# Patient Record
Sex: Male | Born: 1935 | Race: White | Hispanic: No | Marital: Single | Smoking: Never smoker
Health system: Southern US, Community
[De-identification: ages and names within clinical notes are randomized; demographics above are authoritative.]

## PROBLEM LIST (undated history)

## (undated) DIAGNOSIS — N529 Male erectile dysfunction, unspecified: Secondary | ICD-10-CM

## (undated) DIAGNOSIS — R350 Frequency of micturition: Secondary | ICD-10-CM

## (undated) DIAGNOSIS — R42 Dizziness and giddiness: Secondary | ICD-10-CM

## (undated) HISTORY — DX: Frequency of micturition: R35.0

## (undated) HISTORY — DX: Dizziness and giddiness: R42

## (undated) HISTORY — PX: TONSILLECTOMY: SUR1361

## (undated) HISTORY — PX: OTHER SURGICAL HISTORY: SHX169

## (undated) HISTORY — DX: Male erectile dysfunction, unspecified: N52.9

---

## 2004-11-26 ENCOUNTER — Ambulatory Visit: Payer: Self-pay | Admitting: Internal Medicine

## 2007-04-28 ENCOUNTER — Ambulatory Visit: Payer: Self-pay | Admitting: Internal Medicine

## 2007-07-28 ENCOUNTER — Ambulatory Visit: Payer: Self-pay | Admitting: Internal Medicine

## 2009-11-30 ENCOUNTER — Emergency Department: Payer: Self-pay | Admitting: Emergency Medicine

## 2011-03-26 ENCOUNTER — Emergency Department: Payer: Self-pay | Admitting: Unknown Physician Specialty

## 2013-11-17 DIAGNOSIS — M5416 Radiculopathy, lumbar region: Secondary | ICD-10-CM | POA: Insufficient documentation

## 2014-01-10 DIAGNOSIS — M47817 Spondylosis without myelopathy or radiculopathy, lumbosacral region: Secondary | ICD-10-CM | POA: Insufficient documentation

## 2014-02-26 ENCOUNTER — Other Ambulatory Visit: Payer: Self-pay | Admitting: Internal Medicine

## 2014-02-26 DIAGNOSIS — IMO0002 Reserved for concepts with insufficient information to code with codable children: Secondary | ICD-10-CM | POA: Insufficient documentation

## 2014-02-26 DIAGNOSIS — F209 Schizophrenia, unspecified: Secondary | ICD-10-CM | POA: Insufficient documentation

## 2014-02-26 DIAGNOSIS — D696 Thrombocytopenia, unspecified: Secondary | ICD-10-CM | POA: Insufficient documentation

## 2014-02-26 DIAGNOSIS — R569 Unspecified convulsions: Secondary | ICD-10-CM | POA: Insufficient documentation

## 2014-02-26 LAB — PHENYTOIN LEVEL, TOTAL: Dilantin: 15.5 ug/mL (ref 10.0–20.0)

## 2014-09-04 ENCOUNTER — Ambulatory Visit: Payer: Self-pay | Admitting: Internal Medicine

## 2014-09-04 DIAGNOSIS — D649 Anemia, unspecified: Secondary | ICD-10-CM | POA: Insufficient documentation

## 2014-09-04 LAB — PHENYTOIN LEVEL, TOTAL: Dilantin: 19.3 ug/mL (ref 10.0–20.0)

## 2015-04-03 ENCOUNTER — Telehealth: Payer: Self-pay | Admitting: Urology

## 2015-04-03 NOTE — Telephone Encounter (Signed)
/  Spoke with pt who stated he will just wait until Dr. Edwyna Shell gets back. Pt stated he has an appt 04/25/15. Cw,lpn

## 2015-04-03 NOTE — Telephone Encounter (Signed)
That is a pretty low dose of sildenafil (20 mg).  Viagra is 50 mg to 100 mg.  He is also taking phenytoin which can reduce the effectiveness of sildenafil.  If had such a severe reaction to such a low dose, I don't believe he is a candidate for oral medications for ED.  He should have an office visit to discuss other treatment options, such as: Trimix, MUSE or vacuum erect aid devices.

## 2015-04-03 NOTE — Telephone Encounter (Signed)
Patient called stating that he tried one pill of the Sildenafil 20mg  last night and it caused him to have extreme weakness and numbness in his legs he could barely walk. He states that he already has trouble with his legs but this was not the issue. I instructed the patient to not take anymore. He states that he would like to try another medication for his ED. Can a different oral medication be sent for him to try or does he need an apt with Dr. Edwyna Shell to evaluate for different treatment options.

## 2015-04-24 ENCOUNTER — Ambulatory Visit: Payer: Self-pay | Admitting: Urology

## 2015-04-25 ENCOUNTER — Encounter: Payer: Self-pay | Admitting: Urology

## 2015-04-25 ENCOUNTER — Ambulatory Visit (INDEPENDENT_AMBULATORY_CARE_PROVIDER_SITE_OTHER): Payer: Self-pay | Admitting: Urology

## 2015-04-25 VITALS — BP 108/74 | HR 86 | Resp 18 | Ht 72.0 in | Wt 128.0 lb

## 2015-04-25 DIAGNOSIS — N5203 Combined arterial insufficiency and corporo-venous occlusive erectile dysfunction: Secondary | ICD-10-CM

## 2015-04-25 NOTE — Progress Notes (Signed)
04/25/2015 10:48 AM   Ryan PetersWilliam T Dalton 12-Sep-1930 [id number]  Referring provider: No referring provider defined for this encounter.  Chief Complaint  Patient presents with  . Follow-up    1 mo. f/u  . Erectile Dysfunction    HPI: Patient is an 81 year old who is unable to sustain an erection much less have one. He has tried sildenafil 20 mg #3 and has had no erectile function with this. His some side effects from this drug R leg pain and joint pain. He has normal anatomy uncircumcised phallus. It's been 7 months since he's been able to have an erection. I gave him Cialis 5 mg daily to try and see if this made a difference in his erections. We also discussed possibility of self injection either trimix or alprostadil. Back and also a consideration the wound might prevent   PMH: No past medical history on file.  Surgical History: No past surgical history on file.  Home Medications:    Medication List    Notice  As of 04/25/2015 10:48 AM   You have not been prescribed any medications.      Allergies: Allergies not on file  Family History: No family history on file.  Social History:  has no tobacco, alcohol, and drug history on file.  ROS:.Urological Symptom ReviewUROLOGY Frequent Urination?: Yes Hard to postpone urination?: No Burning/pain with urination?: No Get up at night to urinate?: Yes Leakage of urine?: No Urine stream starts and stops?: No Trouble starting stream?: No Do you have to strain to urinate?: No Blood in urine?: No Urinary tract infection?: No Sexually transmitted disease?: No Injury to kidneys or bladder?: No Painful intercourse?: No Weak stream?: No Erection problems?: Yes Penile pain?: No Gastrointestinal Nausea?: No Vomiting?: No Indigestion/heartburn?: No Diarrhea?: No Constipation?: No Constitutional Fever: No Night sweats?: No Weight loss?: No Fatigue?: No Skin Skin rash/lesions?: No Itching?: No Eyes Blurred vision?:  No Double vision?: No Ears/Nose/Throat Sore throat?: No Sinus problems?: No Hematologic/Lymphatic Swollen glands?: No Easy bruising?: No Cardiovascular Leg swelling?: No Chest pain?: No Respiratory Cough?: No Shortness of breath?: No Endocrine Excessive thirst?: No Musculoskeletal Back pain?: No Joint pain?: No Neurological Headaches?: No Dizziness?: No Psychologic Depression?: No Anxiety?: No               Physical Exam: There were no vitals taken for this visit.  Constitutional:  Alert and oriented, No acute distress. HEENT:  AT, moist mucus membranes.  Trachea midline, no masses. Cardiovascular: No clubbing, cyanosis, or edema. Respiratory: Normal respiratory effort, no increased work of breathing. GI: Abdomen is soft, nontender, nondistended, no abdominal masses GU: No CVA tenderness. Penis and testes normal Skin: No rashes, bruises or suspicious lesions. Lymph: No cervical or inguinal adenopathy. Neurologic: Grossly intact, no focal deficits, moving all 4 extremities. Psychiatric: Normal mood and affect.  Laboratory Data: No results found for: WBC, HGB, HCT, MCV, PLT  No results found for: CREATININE  No results found for: PSA  No results found for: TESTOSTERONE  No results found for: HGBA1C  Urinalysis No results found for: COLORURINE, APPEARANCEUR, LABSPEC, PHURINE, GLUCOSEU, HGBUR, BILIRUBINUR, KETONESUR, PROTEINUR, UROBILINOGEN, NITRITE, LEUKOCYTESUR  Pertinent Imaging: None  Assessment & Plan:  Erectile dysfunction 81 year old male. Plan to switch from sildenafil to tell [place] the form of Cialis 5 mg daily. Follow-up visit in 6 weeks samples are given to the patient Erectile dysfunction  No Follow-up on file.  Lorraine Laxichard D Buel Molder, MD  [facility] [address], [address] [place],  [place] [address] [phone]

## 2015-05-03 ENCOUNTER — Telehealth: Payer: Self-pay

## 2015-05-03 NOTE — Telephone Encounter (Signed)
Pt called c/o dizziness with taking cialis. Nurse advised pt not to take medication anymore. Pt was not happy with the answer and asked then "what am I supposed to do"? Pt was very addamant about having an erection. Pt stated at this time he was taking 1.5-2 pills a day for erection purposes. Nurse strongly advised pt only to take 1 pill a day. Pt voiced understanding. Nurse advised pt to take only 1 pill a day over the weekend and to call back with effects. Nurse made pt aware if dizziness continues then the medication would have to be stopped and something else rx. Pt voiced understanding. Cw,lpn

## 2015-06-11 ENCOUNTER — Ambulatory Visit (INDEPENDENT_AMBULATORY_CARE_PROVIDER_SITE_OTHER): Payer: BLUE CROSS/BLUE SHIELD | Admitting: Urology

## 2015-06-11 ENCOUNTER — Encounter: Payer: Self-pay | Admitting: Urology

## 2015-06-11 VITALS — BP 98/63 | HR 82 | Ht 74.0 in | Wt 127.5 lb

## 2015-06-11 DIAGNOSIS — N529 Male erectile dysfunction, unspecified: Secondary | ICD-10-CM | POA: Diagnosis not present

## 2015-06-11 NOTE — Progress Notes (Signed)
06/11/2015 12:05 PM   Ryan Dalton [birth year] [id number]  Referring provider: No referring provider defined for this encounter.  Chief Complaint  Patient presents with  . Erectile Dysfunction    6-8 week followup    HPI: Patient did not have good success was Cialis 5 mg daily basis. We think he is made and taken to a day but patient said he took 1 caused chest pain shortness breath so he stopped. Side discussed with him the use of Trimix and injectable into his penis for his sexual dysfunction will call that prescription in for him. HPI   PMH: Past Medical History  Diagnosis Date  . ED (erectile dysfunction)   . Urinary frequency   . Dizziness     Surgical History: Past Surgical History  Procedure Laterality Date  . Tonsillectomy    . Cataract surgery      Home Medications:    Medication List       This list is accurate as of: 06/11/15 12:05 PM.  Always use your most recent med list.               haloperidol 1 MG tablet  Commonly known as:  HALDOL     haloperidol decanoate 100 MG/ML injection  Commonly known as:  HALDOL DECANOATE     phenytoin 100 MG ER capsule  Commonly known as:  DILANTIN     PX COMPLETE SENIOR MULTIVITS Tabs  Take by mouth.        Allergies: No Known Allergies  Family History: Family History  Problem Relation Age of Onset  . Prostate cancer Neg Hx   . Kidney cancer Neg Hx   . Bladder Cancer Neg Hx     Social History:  reports that he has never smoked. He does not have any smokeless tobacco history on file. He reports that he does not drink alcohol or use illicit drugs.  ROS: UROLOGY Frequent Urination?: Yes Hard to postpone urination?: No Burning/pain with urination?: No Get up at night to urinate?: No Leakage of urine?: No Urine stream starts and stops?: No Trouble starting stream?: No Do you have to strain to urinate?: No Blood in urine?: No Urinary tract infection?: No Sexually transmitted disease?:  No Injury to kidneys or bladder?: No Painful intercourse?: No Weak stream?: No Erection problems?: Yes Penile pain?: No  Gastrointestinal Nausea?: No Vomiting?: No Indigestion/heartburn?: No Diarrhea?: No Constipation?: No  Constitutional Fever: No Night sweats?: No Weight loss?: No Fatigue?: No  Skin Skin rash/lesions?: No Itching?: No  Eyes Blurred vision?: No Double vision?: No  Ears/Nose/Throat Sore throat?: No Sinus problems?: No  Hematologic/Lymphatic Swollen glands?: No Easy bruising?: No  Cardiovascular Leg swelling?: No Chest pain?: No  Respiratory Cough?: No Shortness of breath?: No  Endocrine Excessive thirst?: No  Musculoskeletal Back pain?: No Joint pain?: No  Neurological Headaches?: No Dizziness?: Yes  Psychologic Depression?: No Anxiety?: No  Physical Exam: BP 98/63 mmHg  Pulse 82  Ht 6\' 2"  (1.88 m)  Wt 127 lb 8 oz (57.834 kg)  BMI 16.36 kg/m2  Constitutional:  Alert and oriented, No acute distress. HEENT: [place] AT, moist mucus membranes.  Trachea midline, no masses. Cardiovascular: No clubbing, cyanosis, or edema. Respiratory: Normal respiratory effort, no increased work of breathing. GI: Abdomen is soft, nontender, nondistended, no abdominal masses GU: No CVA tenderness.  Skin: No rashes, bruises or suspicious lesions. Lymph: No cervical or inguinal adenopathy. Neurologic: Grossly intact, no focal deficits, moving all 4 extremities. Psychiatric: Normal  mood and affect.  Laboratory Data: No results found for: WBC, HGB, HCT, MCV, PLT  No results found for: CREATININE  No results found for: PSA  No results found for: TESTOSTERONE  No results found for: HGBA1C  Urinalysis No results found for: COLORURINE, APPEARANCEUR, LABSPEC, PHURINE, GLUCOSEU, HGBUR, BILIRUBINUR, KETONESUR, PROTEINUR, UROBILINOGEN, NITRITE, LEUKOCYTESUR  Pertinent Imaging: None  Assessment and Plan: Impotence. Called in prescription to  compounding lab for Trymex show the patient uses next visit in 2 weeks      Problem List Items Addressed This Visit    None      No Follow-up on file.  Lorraine Lax, MD  [facility] [address], [address] [place], [place] [address] [phone]

## 2015-06-27 ENCOUNTER — Ambulatory Visit (INDEPENDENT_AMBULATORY_CARE_PROVIDER_SITE_OTHER): Payer: Medicare Other | Admitting: Urology

## 2015-06-27 ENCOUNTER — Encounter: Payer: Self-pay | Admitting: Urology

## 2015-06-27 VITALS — BP 93/65 | HR 76 | Resp 16 | Wt 126.6 lb

## 2015-06-27 DIAGNOSIS — N529 Male erectile dysfunction, unspecified: Secondary | ICD-10-CM

## 2015-06-27 NOTE — Progress Notes (Signed)
Patient has a 0.1 mL of Trymex. Injected in the right side of his penis. Instructed him how to do it and had him do it with some help. He'll inform us as to the effect positive or negative Trimix we can make a decision as to whether he needs of a greater dose of the Trimix. He'll return in 1 week for recheck on his skills in doing self injections.

## 2015-07-04 ENCOUNTER — Ambulatory Visit (INDEPENDENT_AMBULATORY_CARE_PROVIDER_SITE_OTHER): Payer: Medicare Other | Admitting: Urology

## 2015-07-04 VITALS — BP 101/65 | HR 87 | Ht 74.0 in | Wt 127.1 lb

## 2015-07-04 DIAGNOSIS — N529 Male erectile dysfunction, unspecified: Secondary | ICD-10-CM

## 2015-07-23 ENCOUNTER — Ambulatory Visit (INDEPENDENT_AMBULATORY_CARE_PROVIDER_SITE_OTHER): Payer: Medicare Other | Admitting: Urology

## 2015-07-23 VITALS — BP 94/64 | HR 88 | Ht 74.0 in | Wt 127.7 lb

## 2015-07-23 DIAGNOSIS — N529 Male erectile dysfunction, unspecified: Secondary | ICD-10-CM

## 2015-07-23 NOTE — Progress Notes (Signed)
Injected 0.3 mg Trymex today. Patient failed 0.10 as well as each of self injection tomorrow in order his Trymex at 0.3 mg per injection. He will need to be taught how to do this in the future.

## 2015-07-25 ENCOUNTER — Ambulatory Visit (INDEPENDENT_AMBULATORY_CARE_PROVIDER_SITE_OTHER): Payer: Medicare Other | Admitting: Urology

## 2015-07-25 ENCOUNTER — Encounter: Payer: Self-pay | Admitting: Urology

## 2015-07-25 VITALS — BP 118/81 | HR 73 | Ht 74.0 in | Wt 129.0 lb

## 2015-07-25 DIAGNOSIS — N529 Male erectile dysfunction, unspecified: Secondary | ICD-10-CM | POA: Diagnosis not present

## 2015-07-25 NOTE — Progress Notes (Signed)
The patient has another injection Trymex today of 0.5 mg into the right corpora today. He'll let us know is results and it he gets some results from and will give him another injection in a couple of weeks. He had a partial erection with the 0.4 dose 2 days ago. I think we finally found acceptable dose. He has no other complaints today penis is well circumcised easy to inject as good length and girth.

## 2015-07-25 NOTE — Progress Notes (Signed)
Inject the patient with 0.3 mg of Trymex. This doesn't work he is to come back and we will give him a new Trymex solution.

## 2015-08-07 ENCOUNTER — Ambulatory Visit (INDEPENDENT_AMBULATORY_CARE_PROVIDER_SITE_OTHER): Payer: Medicare Other | Admitting: Urology

## 2015-08-07 ENCOUNTER — Encounter: Payer: Self-pay | Admitting: Urology

## 2015-08-07 VITALS — BP 112/74 | HR 82 | Ht 74.0 in | Wt 127.8 lb

## 2015-08-07 DIAGNOSIS — N5201 Erectile dysfunction due to arterial insufficiency: Secondary | ICD-10-CM | POA: Diagnosis not present

## 2015-08-07 NOTE — Progress Notes (Signed)
Patient is doing well on 0.5 mL Trimex. We will order several syringes so he can have them when necessary. He feels the 10 doses that should last him quite a while. He is quite pleased with our help in his desire for erections. He's been warned of course about priapism but he has finally found a dose of Trimex or chills.

## 2015-08-07 NOTE — Progress Notes (Signed)
I called in a script for x 10(ten) 1ml vial syringes of Trimix 0.635ml same strength.

## 2015-08-20 ENCOUNTER — Ambulatory Visit (INDEPENDENT_AMBULATORY_CARE_PROVIDER_SITE_OTHER): Payer: Medicare Other | Admitting: Urology

## 2015-08-20 VITALS — BP 88/58 | HR 70 | Ht 74.0 in | Wt 126.3 lb

## 2015-08-20 DIAGNOSIS — N529 Male erectile dysfunction, unspecified: Secondary | ICD-10-CM | POA: Diagnosis not present

## 2015-08-20 NOTE — Progress Notes (Signed)
Patient was able to self inject today 0.5with out difficulty. We will continue to flutter his Trymex through this office. We will phone in to the pharmacy  Necessary. if he has any trouble with his injections he can certainly return to come in and regular his injections skills

## 2015-10-10 ENCOUNTER — Encounter: Payer: Self-pay | Admitting: Urology

## 2015-10-10 ENCOUNTER — Other Ambulatory Visit: Payer: Self-pay

## 2015-10-10 ENCOUNTER — Ambulatory Visit: Payer: Medicare Other | Admitting: Urology

## 2015-10-17 ENCOUNTER — Ambulatory Visit (INDEPENDENT_AMBULATORY_CARE_PROVIDER_SITE_OTHER): Payer: Medicare Other | Admitting: Urology

## 2015-10-17 ENCOUNTER — Encounter: Payer: Self-pay | Admitting: Urology

## 2015-10-17 VITALS — BP 101/67 | HR 76 | Ht 74.0 in | Wt 126.1 lb

## 2015-10-17 DIAGNOSIS — N5319 Other ejaculatory dysfunction: Secondary | ICD-10-CM | POA: Diagnosis not present

## 2015-10-17 DIAGNOSIS — N5201 Erectile dysfunction due to arterial insufficiency: Secondary | ICD-10-CM | POA: Insufficient documentation

## 2015-10-17 NOTE — Progress Notes (Signed)
10/17/2015 11:51 AM   Ryan Dalton 07/12/1930 [id number]  Referring provider: Rafael BihariJohn B Walker III, MD [phone] [facility] [place], [place] [id number]  Chief Complaint  Patient presents with  . Follow-up    erecticle dysfunction    HPI: Ryan Dalton is a 81yo seen in followup for ED.  He has been doing trimix with fair results. He is currently out of his medication and is requesting a refill.  PDE5s did not help. He is concerned about anejaculation also.  He denies any LUTS.    PMH: Past Medical History  Diagnosis Date  . ED (erectile dysfunction)   . Urinary frequency   . Dizziness     Surgical History: Past Surgical History  Procedure Laterality Date  . Tonsillectomy    . Cataract surgery      Home Medications:    Medication List       This list is accurate as of: 10/17/15 11:51 AM.  Always use your most recent med list.               FLUZONE HIGH-DOSE 0.5 ML Susy  Generic drug:  Influenza Vac Split High-Dose  Reported on 10/17/2015     haloperidol 1 MG tablet  Commonly known as:  HALDOL     haloperidol decanoate 100 MG/ML injection  Commonly known as:  HALDOL DECANOATE  Reported on 10/17/2015     phenytoin 100 MG ER capsule  Commonly known as:  DILANTIN     PX COMPLETE SENIOR MULTIVITS Tabs  Take by mouth.        Allergies: No Known Allergies  Family History: Family History  Problem Relation Age of Onset  . Prostate cancer Neg Hx   . Kidney cancer Neg Hx   . Bladder Cancer Neg Hx     Social History:  reports that he has never smoked. He does not have any smokeless tobacco history on file. He reports that he does not drink alcohol or use illicit drugs.  ROS:                                        Physical Exam: BP 101/67 mmHg  Pulse 76  Ht 6\' 2"  (1.88 m)  Wt 57.199 kg (126 lb 1.6 oz)  BMI 16.18 kg/m2  Constitutional:  Alert and oriented, No acute distress. HEENT: [place] AT, moist  mucus membranes.  Trachea midline, no masses. Cardiovascular: No clubbing, cyanosis, or edema. Respiratory: Normal respiratory effort, no increased work of breathing. GI: Abdomen is soft, nontender, nondistended, no abdominal masses GU: No CVA tenderness.  Skin: No rashes, bruises or suspicious lesions. Lymph: No cervical or inguinal adenopathy. Neurologic: Grossly intact, no focal deficits, moving all 4 extremities. Psychiatric: Normal mood and affect.  Laboratory Data: No results found for: WBC, HGB, HCT, MCV, PLT  No results found for: CREATININE  No results found for: PSA  No results found for: TESTOSTERONE  No results found for: HGBA1C  Urinalysis No results found for: COLORURINE, APPEARANCEUR, LABSPEC, PHURINE, GLUCOSEU, HGBUR, BILIRUBINUR, KETONESUR, PROTEINUR, UROBILINOGEN, NITRITE, LEUKOCYTESUR  Pertinent Imaging: none  Assessment & Plan:    1. Erectile dysfunction: Trimix RX refilled. Pt to come in for trimix teaching  There are no diagnoses linked to this encounter.  No Follow-up on file.  Malen GauzeMCKENZIE, Travonte Byard L, MD  [facility] [address], [address] [place], [place] [address] [phone]    

## 2015-10-25 ENCOUNTER — Ambulatory Visit: Payer: Medicare Other

## 2015-10-25 ENCOUNTER — Encounter: Payer: Self-pay | Admitting: Obstetrics and Gynecology

## 2015-10-25 ENCOUNTER — Ambulatory Visit (INDEPENDENT_AMBULATORY_CARE_PROVIDER_SITE_OTHER): Payer: Medicare Other | Admitting: Obstetrics and Gynecology

## 2015-10-25 VITALS — BP 102/71 | HR 89 | Ht 74.0 in | Wt 124.4 lb

## 2015-10-25 DIAGNOSIS — N529 Male erectile dysfunction, unspecified: Secondary | ICD-10-CM

## 2015-10-25 NOTE — Patient Instructions (Signed)
Priapism °Priapism is an unwanted erection of the penis that usually develops without sexual stimulation or desire. Priapism affects males of all ages. There are three types of priapism: °· Recurrent acute priapism. With this type, erections are painful and last less than 3 hours. The erections come and go. °· Acute prolonged priapism. With this type, erections are painful and last hours to days. This type can lead to erectile dysfunction. °· Persistent priapism. With this type, erections are usually painless and can last weeks to years. The penis gets erect but not rigid. This type can lead to erectile dysfunction. °CAUSES °This condition develops either when blood has difficulty leaving the penis (low-flow priapism) or if too much blood flows into the penis (high-flow priapism). Blood flow issues may be caused by: °· Erectile dysfunction medicine. This is the most common cause. °· Medicine that is used to treat depression and anxiety. °· Blood problems that are common in people who have sickle cell disease or leukemia. °· Use of illegal drugs, such as cocaine and marijuana. °· Excessive alcohol use. °· Neurological problems, such as multiple sclerosis. °· Diabetes mellitus. °· An injury to the penis. °· An infection. °In some cases, the cause may not be known. °SYMPTOMS °Symptoms of this condition include: °· A prolonged erection. °· A painful erection. °DIAGNOSIS °This condition is diagnosed with a physical exam. Blood tests may be done to help identify the cause of the condition. °TREATMENT °Treatment for this condition depends on the cause and the type of priapism. Recurrent acute priapism is often managed at home. Acute prolonged priapism is usually treated at a hospital, where treatment may involve: °· Getting fluid and medicines for pain through an IV tube. °· A blood transfusion. °· A procedure to drain blood from the penis. °· Surgery to make a passageway for blood to flow in the penis (surgical  shunting). °No standard treatment exists for persistent priapism. °HOME CARE INSTRUCTIONS °Managing Recurrent Priapism °· Try taking a warm bath or exercising. °· Keep track of how long your erection lasts. If it does not go away in 3 hours, seek medical care. °General Instructions °· Avoid sexual stimulation and intercourse until your health care provider says that they are okay for you. °· Avoid drugs or alcohol if they caused the priapism. Avoiding them can help to prevent the condition from coming back. °· Drink enough fluid to keep your urine clear or pale yellow. °· Empty your bladder as much as possible. °· Take over-the-counter and prescription medicines only as told by your health care provider. °· Do not take any medicines during an episode unless you get approval from your health care provider. °· Keep all follow-up visits as told by your health care provider. This is important. °SEEK MEDICAL CARE IF: °· Your pain gets worse. °· Your pain does not improve with treatment. °· You have recurrent priapism and your erection does not go away in 3 hours. °SEEK IMMEDIATE MEDICAL CARE IF: °· You have a fever or chills. °· You have pain, swelling, or redness in your genitals or your groin area. °  °This information is not intended to replace advice given to you by your health care provider. Make sure you discuss any questions you have with your health care provider. °  °Document Released: 12/26/2003 Document Revised: 06/26/2015 Document Reviewed: 01/08/2015 °Elsevier Interactive Patient Education ©2016 [employer] ° °

## 2015-10-25 NOTE — Progress Notes (Signed)
10/25/2015 1:28 PM   Ryan PetersWilliam T Dalton Jul 29, 1930 [id number]  Referring provider: Rafael BihariJohn B Walker III, MD [phone] [facility] [place], [place] [id number]  Chief Complaint  Patient presents with  . Erectile Dysfunction    Trimix Injection    HPI: Patient is an 82 year old gentleman with a long-standing history of erectile dysfunction. He has taken PDE5 inhibitors in the past without satisfactory results. He has been receiving tri-mix injections for many months now he reports good results with erections.  His previously been seen by Dr. Edwyna Dalton as well as Dr. Ronne Dalton. He was receiving approximately every weekly injections by Dr. Edwyna Dalton.  He has repeatedly been taught how to self inject though he continues to schedule appointments and return for injections by provider.  He states that he does not feel comfortable performing injections at home himself. He denies any penile pain sore issues with priapism the past.  PMH: Past Medical History  Diagnosis Date  . ED (erectile dysfunction)   . Urinary frequency   . Dizziness     Surgical History: Past Surgical History  Procedure Laterality Date  . Tonsillectomy    . Cataract surgery      Home Medications:    Medication List       This list is accurate as of: 10/25/15  1:28 PM.  Always use your most recent med list.               FLUZONE HIGH-DOSE 0.5 ML Susy  Generic drug:  Influenza Vac Split High-Dose  Reported on 10/17/2015     haloperidol 1 MG tablet  Commonly known as:  HALDOL     haloperidol decanoate 100 MG/ML injection  Commonly known as:  HALDOL DECANOATE  Reported on 10/17/2015     phenytoin 100 MG ER capsule  Commonly known as:  DILANTIN     PX COMPLETE SENIOR MULTIVITS Tabs  Take by mouth.        Allergies: No Known Allergies  Family History: Family History  Problem Relation Age of Onset  . Prostate cancer Neg Hx   . Kidney cancer Neg Hx   . Bladder Cancer Neg Hx     Social  History:  reports that he has never smoked. He does not have any smokeless tobacco history on file. He reports that he does not drink alcohol or use illicit drugs.  ROS:                                        Physical Exam: BP 102/71 mmHg  Pulse 89  Ht 6\' 2"  (1.88 m)  Wt 124 lb 6.4 oz (56.427 kg)  BMI 15.97 kg/m2  Constitutional:  Alert and oriented, No acute distress. HEENT: [place] AT, moist mucus membranes.  Trachea midline, no masses. GU: circumcised phallus Skin: No rashes, bruises or suspicious lesions. Neurologic: Grossly intact, no focal deficits, moving all 4 extremities. Psychiatric: Normal mood and flat affect.  Laboratory Data:   Urinalysis No results found for: COLORURINE, APPEARANCEUR, LABSPEC, PHURINE, GLUCOSEU, HGBUR, BILIRUBINUR, KETONESUR, PROTEINUR, UROBILINOGEN, NITRITE, LEUKOCYTESUR  Pertinent Imaging:   Assessment & Plan:    1.Erectile Dysfunction- 0.395mL Trimix injected into right lateral aspect of penis.  Patient tolerated procedure well and I discussed with him that this is not a curative treatment. He seems to be confused and was asking if he could increase his injections to daily. I explained  to him that tri-mix injections are to be administered as needed when an erection it is desired and are not to be used on a scheduled basis.  After patient's office visit I discussed the patient's situation with my supervising physician Ryan Dalton. We agree that it is not in the patient's best interests for him to continue have tri-mix injections administered here in the office nor is it medically necessary any longer. If he cannot learn how to self administer his Trimix we will need to discontinue the medication. I will discuss this with the patient had his best visit.  There are no diagnoses linked to this encounter.  Return in about 1 week (around 11/01/2015) for Trimix injection.  These notes generated with voice recognition software. I  apologize for typographical errors.  Ryan Lou, FNP  [facility] [address] [place], [place] [address] [phone]

## 2015-11-01 ENCOUNTER — Encounter: Payer: Self-pay | Admitting: Obstetrics and Gynecology

## 2015-11-01 ENCOUNTER — Ambulatory Visit (INDEPENDENT_AMBULATORY_CARE_PROVIDER_SITE_OTHER): Payer: Medicare Other | Admitting: Obstetrics and Gynecology

## 2015-11-01 VITALS — BP 99/66 | HR 84 | Resp 16 | Ht 74.0 in | Wt 126.0 lb

## 2015-11-01 DIAGNOSIS — N529 Male erectile dysfunction, unspecified: Secondary | ICD-10-CM

## 2015-11-01 NOTE — Progress Notes (Signed)
11/01/2015 11:34 AM   Jeralene PetersWilliam T Bradshaw [birth year] [id number]  Referring provider: Rafael BihariJohn B Walker III, MD [phone] [facility] [address] [facility] [place], [place] [id number]  Chief Complaint  Patient presents with  . Erectile Dysfunction    Trimix inj.    HPI: Patient is an 82 year old male with a long-standing history of erectile dysfunction. He was previously receiving assistance from Dr. Edwyna ShellHart every weekly with his Trimix injections. He is been instructed on self administration numerous times by different providers. He continued to request to return weekly to have injections administered here.  I have discussed patient's continued to request to have injections administered here with the other providers as well as my his supervising physician Dr. Apolinar JunesBrandon we are in agreement that either patient needs to fill comfortable administering injections at home or we need to discontinue the Trymex administrations for erectile dysfunction.   PMH: Past Medical History  Diagnosis Date  . ED (erectile dysfunction)   . Urinary frequency   . Dizziness     Surgical History: Past Surgical History  Procedure Laterality Date  . Tonsillectomy    . Cataract surgery      Home Medications:    Medication List       This list is accurate as of: 11/01/15 11:34 AM.  Always use your most recent med list.               FLUZONE HIGH-DOSE 0.5 ML Susy  Generic drug:  Influenza Vac Split High-Dose  Reported on 10/17/2015     haloperidol 1 MG tablet  Commonly known as:  HALDOL     haloperidol decanoate 100 MG/ML injection  Commonly known as:  HALDOL DECANOATE  Reported on 10/17/2015     phenytoin 100 MG ER capsule  Commonly known as:  DILANTIN     PX COMPLETE SENIOR MULTIVITS Tabs  Take by mouth.        Allergies: No Known Allergies  Family History: Family History  Problem Relation Age of Onset  . Prostate cancer Neg Hx   . Kidney cancer Neg Hx   . Bladder Cancer Neg Hx      Social History:  reports that he has never smoked. He does not have any smokeless tobacco history on file. He reports that he does not drink alcohol or use illicit drugs.  ROS: UROLOGY Frequent Urination?: No Hard to postpone urination?: No Burning/pain with urination?: No Get up at night to urinate?: No Leakage of urine?: No Urine stream starts and stops?: No Trouble starting stream?: No Do you have to strain to urinate?: No Blood in urine?: No Urinary tract infection?: No Sexually transmitted disease?: No Injury to kidneys or bladder?: No Painful intercourse?: No Weak stream?: No Erection problems?: Yes Penile pain?: No  Gastrointestinal Nausea?: No Vomiting?: No Indigestion/heartburn?: No Diarrhea?: No Constipation?: No  Constitutional Fever: No Night sweats?: No Weight loss?: No Fatigue?: No  Skin Skin rash/lesions?: No Itching?: No  Eyes Blurred vision?: No Double vision?: No  Ears/Nose/Throat Sore throat?: No Sinus problems?: No  Hematologic/Lymphatic Swollen glands?: No Easy bruising?: No  Cardiovascular Leg swelling?: No Chest pain?: No  Respiratory Cough?: No Shortness of breath?: No  Endocrine Excessive thirst?: No  Musculoskeletal Back pain?: No Joint pain?: No  Neurological Headaches?: No Dizziness?: No  Psychologic Depression?: No Anxiety?: No  Physical Exam: BP 99/66 mmHg  Pulse 84  Resp 16  Ht 6\' 2"  (1.88 m)  Wt 126 lb (57.153 kg)  BMI 16.17 kg/m2  Constitutional:  Alert and oriented, No acute distress. HEENT: [place] AT, moist mucus membranes.  Trachea midline, no masses. Cardiovascular: No clubbing, cyanosis, or edema. Respiratory: Normal respiratory effort, no increased work of breathing. Skin: No rashes, bruises or suspicious lesions. Neurologic: Grossly intact, no focal deficits, moving all 4 extremities. Psychiatric: Normal mood and affect.  Laboratory Data:   Urinalysis  Pertinent  Imaging:   Assessment & Plan:   1. Erectile Dysfunction- I discussed with patient in detail that he needs to fill comfortable administering injections at home himself because we can no longer administer them here in the office. He states understanding. I once again reviewed self administration with him and provided him written instruction material on self injections. He'll return in about 6 months for reassessment of symptoms.  There are no diagnoses linked to this encounter.  Return in about 6 months (around 04/30/2016).  These notes generated with voice recognition software. I apologize for typographical errors.  Earlie Lou, FNP  [facility] [address] [place], [place] [address] [phone]

## 2015-11-18 ENCOUNTER — Ambulatory Visit: Payer: Medicare Other

## 2016-04-30 ENCOUNTER — Ambulatory Visit: Payer: Medicare Other | Admitting: Urology

## 2016-05-04 ENCOUNTER — Ambulatory Visit: Payer: Medicare Other | Admitting: Urology

## 2016-05-20 ENCOUNTER — Encounter: Payer: Self-pay | Admitting: Urology

## 2016-05-20 ENCOUNTER — Ambulatory Visit (INDEPENDENT_AMBULATORY_CARE_PROVIDER_SITE_OTHER): Payer: Medicare Other | Admitting: Urology

## 2016-05-20 VITALS — BP 97/61 | HR 80 | Ht 74.0 in | Wt 125.7 lb

## 2016-05-20 DIAGNOSIS — N529 Male erectile dysfunction, unspecified: Secondary | ICD-10-CM

## 2016-05-20 DIAGNOSIS — N138 Other obstructive and reflux uropathy: Secondary | ICD-10-CM

## 2016-05-20 DIAGNOSIS — N401 Enlarged prostate with lower urinary tract symptoms: Secondary | ICD-10-CM

## 2016-05-20 DIAGNOSIS — N528 Other male erectile dysfunction: Secondary | ICD-10-CM | POA: Diagnosis not present

## 2016-05-20 NOTE — Progress Notes (Signed)
05/20/2016 12:25 PM   Ryan Dalton 1930/05/28 [id number]  Referring provider: Rafael Bihari, MD [phone] [facility] [address] [facility] [place], [place] [address]  Chief Complaint  Patient presents with  . Erectile Dysfunction    6 month follow up no showed last appt.    HPI: Patient is an 82 year old Caucasian male with ED who presents today for a 6 month follow up.    Background history Patient with a long-standing history of erectile dysfunction. He was previously receiving assistance from Dr. Edwyna Shell every weekly with his Trimix injections. He is been instructed on self administration numerous times by different providers. He continued to request to return weekly to have injections administered here.  I have discussed patient's continued to request to have injections administered here with the other providers as well as my his supervising physician Dr. Apolinar Junes we are in agreement that either patient needs to fill comfortable administering injections at home or we need to discontinue the Trymex administrations for erectile dysfunction.  BPH WITH LUTS His IPSS score today is 11, which is moderate lower urinary tract symptomatology. He is mostly dissatisfied with his quality life due to his urinary symptoms.   His major complaint today erection problems.  He has had these symptoms for several years.  He denies any dysuria, hematuria or suprapubic pain.  He also denies any recent fevers, chills, nausea or vomiting.  He does not have a family history of PCa.      IPSS    Row Name 05/20/16 1100         International Prostate Symptom Score   How often have you had the sensation of not emptying your bladder? Not at All     How often have you had to urinate less than every two hours? Almost always     How often have you found you stopped and started again several times when you urinated? Not at All     How often have you found it difficult to postpone urination? Not at All      How often have you had a weak urinary stream? Less than half the time     How often have you had to strain to start urination? Not at All     How many times did you typically get up at night to urinate? 4 Times     Total IPSS Score 11       Quality of Life due to urinary symptoms   If you were to spend the rest of your life with your urinary condition just the way it is now how would you feel about that? Mostly Disatisfied        Score:  1-7 Mild 8-19 Moderate 20-35 Severe  Erectile dysfunction His SHIM score is 2, which is severe ED.   He has been having difficulty with erections for several years.   His major complaint is no erections.  His libido is preserved.   His risk factors for ED are age, antipsychotic therapy.  He denies any painful erections or curvatures with his erections.   He has tried Trimix in the past.       SHIM    Row Name 05/20/16 1225         SHIM: Over the last 6 months:   How do you rate your confidence that you could get and keep an erection? Very Low     When you had erections with sexual stimulation, how often were  your erections hard enough for penetration (entering your partner)? Almost Never or Never     During sexual intercourse, how often were you able to maintain your erection after you had penetrated (entered) your partner? Did not attempt intercourse     During sexual intercourse, how difficult was it to maintain your erection to completion of intercourse? Did not attempt intercourse     When you attempted sexual intercourse, how often was it satisfactory for you? Did not attempt intercourse       SHIM Total Score   SHIM 2        Score: 1-7 Severe ED 8-11 Moderate ED 12-16 Mild-Moderate ED 17-21 Mild ED 22-25 No ED      PMH: Past Medical History:  Diagnosis Date  . Dizziness   . ED (erectile dysfunction)   . Urinary frequency     Surgical History: Past Surgical History:  Procedure Laterality Date  . cataract surgery    .  TONSILLECTOMY      Home Medications:    Medication List       Accurate as of 05/20/16 12:25 PM. Always use your most recent med list.          benztropine 0.5 MG tablet Commonly known as:  COGENTIN Take 0.5 mg by mouth 2 (two) times daily.   FLUZONE HIGH-DOSE 0.5 ML Susy Generic drug:  Influenza Vac Split High-Dose Reported on 10/17/2015   haloperidol 1 MG tablet Commonly known as:  HALDOL   haloperidol decanoate 100 MG/ML injection Commonly known as:  HALDOL DECANOATE Reported on 10/17/2015   phenytoin 100 MG ER capsule Commonly known as:  DILANTIN   PX COMPLETE SENIOR MULTIVITS Tabs Take by mouth.       Allergies: No Known Allergies  Family History: Family History  Problem Relation Age of Onset  . Prostate cancer Neg Hx   . Kidney cancer Neg Hx   . Bladder Cancer Neg Hx     Social History:  reports that he has never smoked. He has never used smokeless tobacco. He reports that he does not drink alcohol or use drugs.  ROS: UROLOGY Frequent Urination?: No Hard to postpone urination?: No Burning/pain with urination?: No Get up at night to urinate?: No Leakage of urine?: No Urine stream starts and stops?: No Trouble starting stream?: No Do you have to strain to urinate?: No Blood in urine?: No Urinary tract infection?: No Sexually transmitted disease?: No Injury to kidneys or bladder?: No Painful intercourse?: No Weak stream?: No Erection problems?: No Penile pain?: No  Gastrointestinal Nausea?: No Vomiting?: No Indigestion/heartburn?: No Diarrhea?: No Constipation?: No  Constitutional Fever: No Night sweats?: No Weight loss?: No Fatigue?: No  Skin Skin rash/lesions?: No Itching?: No  Eyes Blurred vision?: No Double vision?: No  Ears/Nose/Throat Sore throat?: No Sinus problems?: No  Hematologic/Lymphatic Swollen glands?: No Easy bruising?: No  Cardiovascular Leg swelling?: No Chest pain?: No  Respiratory Cough?:  No Shortness of breath?: No  Endocrine Excessive thirst?: No  Musculoskeletal Back pain?: No Joint pain?: No  Neurological Headaches?: No Dizziness?: No  Psychologic Depression?: No Anxiety?: No  Physical Exam: BP 97/61   Pulse 80   Ht 6\' 2"  (1.88 m)   Wt 125 lb 11.2 oz (57 kg)   BMI 16.14 kg/m   Constitutional:  Alert and oriented, No acute distress. HEENT: [place] AT, moist mucus membranes.  Trachea midline, no masses. Cardiovascular: No clubbing, cyanosis, or edema. Respiratory: Normal respiratory effort, no increased work of breathing.  Skin: No rashes, bruises or suspicious lesions. Neurologic: Grossly intact, no focal deficits, moving all 4 extremities. Psychiatric: Normal mood and affect.   Assessment & Plan:   1. Erectile Dysfunction:  Patient did not find the Trimix effective.  He stated he will not come here anymore because we have nothing to offer.    2. BPH with LUTS:  See above.     Return if symptoms worsen or fail to improve.  These notes generated with voice recognition software. I apologize for typographical errors.  Michiel Cowboy, PA-C  [facility] [address] [place], [place] [address] [phone]

## 2016-06-17 ENCOUNTER — Emergency Department
Admission: EM | Admit: 2016-06-17 | Discharge: 2016-06-18 | Disposition: A | Discharge status: Home / Self Care | Payer: Medicare Other | Attending: Emergency Medicine | Admitting: Emergency Medicine

## 2016-06-17 ENCOUNTER — Emergency Department: Payer: Medicare Other

## 2016-06-17 ENCOUNTER — Encounter: Payer: Self-pay | Admitting: Emergency Medicine

## 2016-06-17 DIAGNOSIS — R079 Chest pain, unspecified: Secondary | ICD-10-CM | POA: Diagnosis present

## 2016-06-17 DIAGNOSIS — M546 Pain in thoracic spine: Secondary | ICD-10-CM | POA: Insufficient documentation

## 2016-06-17 LAB — BASIC METABOLIC PANEL
Anion gap: 6 (ref 5–15)
BUN: 28 mg/dL — AB (ref 6–20)
CALCIUM: 8.9 mg/dL (ref 8.9–10.3)
CO2: 27 mmol/L (ref 22–32)
CREATININE: 1.46 mg/dL — AB (ref 0.61–1.24)
Chloride: 100 mmol/L — ABNORMAL LOW (ref 101–111)
GFR calc Af Amer: 49 mL/min — ABNORMAL LOW (ref >60)
GFR, EST NON AFRICAN AMERICAN: 42 mL/min — AB (ref >60)
GLUCOSE: 143 mg/dL — AB (ref 65–99)
Potassium: 4.5 mmol/L (ref 3.5–5.1)
Sodium: 133 mmol/L — ABNORMAL LOW (ref 135–145)

## 2016-06-17 LAB — CBC
HCT: 38.9 % — ABNORMAL LOW (ref 40.0–52.0)
Hemoglobin: 13.7 g/dL (ref 13.0–18.0)
MCH: 35.5 pg — ABNORMAL HIGH (ref 26.0–34.0)
MCHC: 35.1 g/dL (ref 32.0–36.0)
MCV: 100.9 fL — ABNORMAL HIGH (ref 80.0–100.0)
Platelets: 127 10*3/uL — ABNORMAL LOW (ref 150–440)
RBC: 3.85 MIL/uL — ABNORMAL LOW (ref 4.40–5.90)
RDW: 13.6 % (ref 11.5–14.5)
WBC: 7.4 10*3/uL (ref 3.8–10.6)

## 2016-06-17 LAB — TROPONIN I

## 2016-06-17 MED ORDER — IOPAMIDOL (ISOVUE-370) INJECTION 76%
75.0000 mL | Freq: Once | INTRAVENOUS | Status: AC | PRN
  Administered 2016-06-17: 75 mL via INTRAVENOUS

## 2016-06-17 MED ORDER — SODIUM CHLORIDE 0.9 % IV SOLN: Freq: Once | INTRAVENOUS | Status: DC

## 2016-06-17 MED ORDER — SODIUM CHLORIDE 0.9 % IV BOLUS (SEPSIS)
1000.0000 mL | Freq: Once | INTRAVENOUS | Status: AC
  Administered 2016-06-17: 1000 mL via INTRAVENOUS

## 2016-06-17 NOTE — ED Notes (Signed)
Patient transported to CT 

## 2016-06-17 NOTE — ED Provider Notes (Signed)
[facility] Emergency Department Provider Note   ____________________________________________   First MD Initiated Contact with Patient 06/17/16 2223     (approximate)  I have reviewed the triage vital signs and the nursing notes.   HISTORY  Chief Complaint Chest Pain; Back Pain; Near Syncope; and Weakness    HPI Ryan Dalton is a 82 y.o. male patient reports she really didn't have chest pain he had back pain in between his shoulder blades and closer to the left shoulder blade. Came on today is gone now. He had some episodes of weakness and feeling like he might pass out. The pain was sharp in nature. Patient cannot say if anything made the pain better or worse. Patient says the pain didn't last very long but can't tell me how long it might lasted. Patient says it was behind his heart.   Past Medical History:  Diagnosis Date  . Dizziness   . ED (erectile dysfunction)   . Urinary frequency     Patient Active Problem List   Diagnosis Date Noted  . Erectile dysfunction due to arterial insufficiency 10/17/2015  . Other ejaculatory dysfunction 10/17/2015  . Absolute anemia 09/04/2014  . 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 Seizure (HCC) 02/26/2014  . Change in blood platelet count 02/26/2014  . Schizophrenia (HCC) 02/26/2014  . Thrombocytopenia (HCC) 02/26/2014  . Lumbar and sacral arthritis 01/10/2014  . Lumbar radiculopathy 11/17/2013    Past Surgical History:  Procedure Laterality Date  . cataract surgery    . TONSILLECTOMY      Prior to Admission medications   Medication Sig Start Date End Date Taking? Authorizing Provider  benztropine (COGENTIN) 0.5 MG tablet Take 0.5 mg by mouth 2 (two) times daily.    Historical Provider, MD  haloperidol (HALDOL) 1 MG tablet  04/01/15   Historical Provider, MD  haloperidol decanoate (HALDOL DECANOATE) 100 MG/ML injection Reported on 10/17/2015 04/15/15   Historical Provider, MD  Multiple  Vitamins-Minerals (PX COMPLETE SENIOR MULTIVITS) TABS Take by mouth.    Historical Provider, MD  phenytoin (DILANTIN) 100 MG ER capsule  04/15/15   Historical Provider, MD    Allergies Review of patient's allergies indicates no known allergies.  Family History  Problem Relation Age of Onset  . Prostate cancer Neg Hx   . Kidney cancer Neg Hx   . Bladder Cancer Neg Hx     Social History Social History  Substance Use Topics  . Smoking status: Never Smoker  . Smokeless tobacco: Never Used  . Alcohol use No    Review of Systems Constitutional: No fever/chills Eyes: No visual changes. ENT: No sore throat. Cardiovascular: Denies chest pain. Respiratory: Denies shortness of breath. Gastrointestinal: No abdominal pain.  No nausea, no vomiting.  No diarrhea.  No constipation. Genitourinary: Negative for dysuria. Musculoskeletal:  back pain. Skin: Negative for rash. Neurological: Negative for headaches, focal weakness or numbness.  10-point ROS otherwise negative.  ____________________________________________   PHYSICAL EXAM:  VITAL SIGNS: ED Triage Vitals  Enc Vitals Group     BP 06/17/16 1753 (!) 109/54     Pulse Rate 06/17/16 1753 72     Resp 06/17/16 1753 18     Temp 06/17/16 1753 98.3 F (36.8 C)     Temp Source 06/17/16 1753 Oral     SpO2 06/17/16 1753 99 %     Weight 06/17/16 1754 120 lb (54.4 kg)     Height 06/17/16 1754 6\' 2"  (1.88 m)  Head Circumference --      Peak Flow --      Pain Score --      Pain Loc --      Pain Edu? --      Excl. in GC? --     Constitutional: Alert and oriented. Well appearing and in no acute distress. Eyes: Conjunctivae are normal. PERRL. EOMI. Head: Atraumatic. Nose: No congestion/rhinnorhea. Mouth/Throat: Mucous membranes are moist.  Oropharynx non-erythematous. Neck: No stridor. No cervical spine tenderness to palpation Cardiovascular: Normal rate, regular rhythm. Grossly normal heart sounds.  Good peripheral  circulation. Respiratory: Normal respiratory effort.  No retractions. Lungs CTAB. Gastrointestinal: Soft and nontender. No distention. No abdominal bruits. No CVA tenderness. Musculoskeletal: No lower extremity tenderness nor edema.  No joint effusions. Neurologic:  Normal speech and language. No gross focal neurologic deficits are appreciated. Skin:  Skin is warm, dry and intact. No rash noted. Psychiatric: Mood and affect are normal. Speech and behavior are normal. Patient is orthostatic ____________________________________________   LABS (all labs ordered are listed, but only abnormal results are displayed)  Labs Reviewed  BASIC METABOLIC PANEL - Abnormal; Notable for the following:       Result Value   Sodium 133 (*)    Chloride 100 (*)    Glucose, Bld 143 (*)    BUN 28 (*)    Creatinine, Ser 1.46 (*)    GFR calc non Af Amer 42 (*)    GFR calc Af Amer 49 (*)    All other components within normal limits  CBC - Abnormal; Notable for the following:    RBC 3.85 (*)    HCT 38.9 (*)    MCV 100.9 (*)    MCH 35.5 (*)    Platelets 127 (*)    All other components within normal limits  TROPONIN I  TROPONIN I   ____________________________________________  EKG  EKG read and interpreted by me shows normal sinus rhythm rate of 75 left axis no acute ST-T wave changes ____________________________________________  RADIOLOGY  CLINICAL DATA:  Upper back pain  EXAM: CT ANGIOGRAPHY CHEST WITH CONTRAST  TECHNIQUE: Multidetector CT imaging of the chest was performed using the standard protocol during bolus administration of intravenous contrast. Multiplanar CT image reconstructions and MIPs were obtained to evaluate the vascular anatomy.  CONTRAST:  75 cc Isovue 370  COMPARISON:  None.  FINDINGS: No filling defects in the pulmonary arterial tree to suggest acute pulmonary thromboembolism.  No abnormal mediastinal adenopathy.  No pneumothorax.  No pleural  effusion  Bibasilar indeterminate nodular parenchymal densities are associated with subsegmental atelectasis. 1.8 x 1.2 cm density at the right lung base on image 89. 1.4 x 0.9 cm left lower lobe nodular density on image 88.  No acute bony deformity.  Calcified granulomata in the liver.  Review of the MIP images confirms the above findings.  IMPRESSION: No evidence of acute pulmonary thromboembolism  Bibasilar indeterminate nodular parenchymal densities are associated with subsegmental atelectasis. Three-month follow-up is recommended to ensure resolution. Initial follow-up by chest CT without contrast is recommended in 3 months to confirm persistence. This recommendation follows the consensus statement: Recommendations for the Management of Subsolid Pulmonary Nodules Detected at CT: A Statement from the Fleischner Society as published in Radiology 2013; 266:304-317.   Electronically Signed   By: Jolaine ClickArthur  Hoss M.D.   On: 06/17/2016 23:26  ____________________________________________   PROCEDURES  Procedure(s) performed:  Procedures  Critical Care performed:   ____________________________________________   INITIAL IMPRESSION / ASSESSMENT  AND PLAN / ED COURSE  Pertinent labs & imaging results that were available during my care of the patient were reviewed by me and considered in my medical decision making (see chart for details).    Clinical Course  Patient has had 2 negative troponins patient's never had any chest pain patient does not appear to have any shortness of breath patient's back pain has been gone for several hours with no return patient is not feeling lightheaded now in police he told me that once then later he said he is only slightly lightheaded. I will have the patient finishes IV fluid and let him go he is able to sit and stand without any unsteadiness will follow-up with his regular doctor especially for CT report that needs  follow-up. ____________________________________________   FINAL CLINICAL IMPRESSION(S) / ED DIAGNOSES  Final diagnoses:  Left-sided thoracic back pain      NEW MEDICATIONS STARTED DURING THIS VISIT:  New Prescriptions   No medications on file     Note:  This document was prepared using Dragon voice recognition software and may include unintentional dictation errors.    Arnaldo Natal, MD 06/17/16 [id number]

## 2016-06-17 NOTE — ED Notes (Signed)
Pt states that symptoms have resolved.

## 2016-06-17 NOTE — Discharge Instructions (Signed)
Please follow-up with your doctor very soon. Have him review the chest CT report. Please return here for any further problems. Please return here or see her doctor especially if you get lightheaded again. Use Tylenol for pain.

## 2016-06-17 NOTE — ED Triage Notes (Signed)
Pt presents with chest pain, back pain, syncopal episode and dizziness today. Pt states also feels weak. Pt with nad noted.

## 2016-09-07 IMAGING — CT CT ANGIO CHEST
2 of 6 series · 19 of 46 positions shown · IV contrast (APPLIED)
Comparison: None.

CLINICAL DATA: Upper back pain

EXAM:
CT ANGIOGRAPHY CHEST WITH CONTRAST
TECHNIQUE: Multidetector CT imaging of the chest was performed using the
standard protocol during bolus administration of intravenous
contrast. Multiplanar CT image reconstructions and MIPs were
obtained to evaluate the vascular anatomy.
CONTRAST:  75 cc Isovue 370

[Series 5: thins · axial · 0.71mm/px · z∈[-622,-310]mm · 17 of 342 slices shown]
[im 15/342  lung]
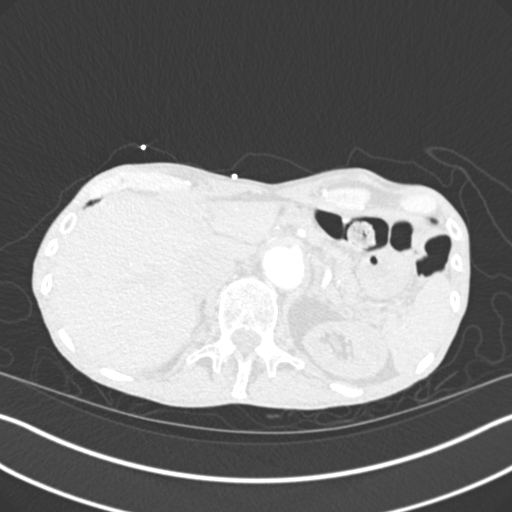
[im 30/342  soft-tissue]
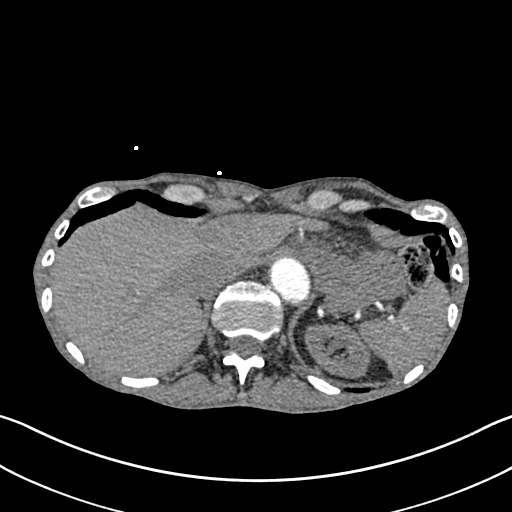
[im 60/342  lung]
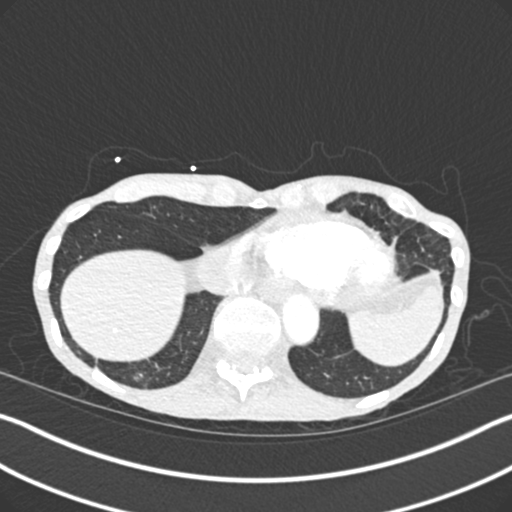
[im 75/342  soft-tissue]
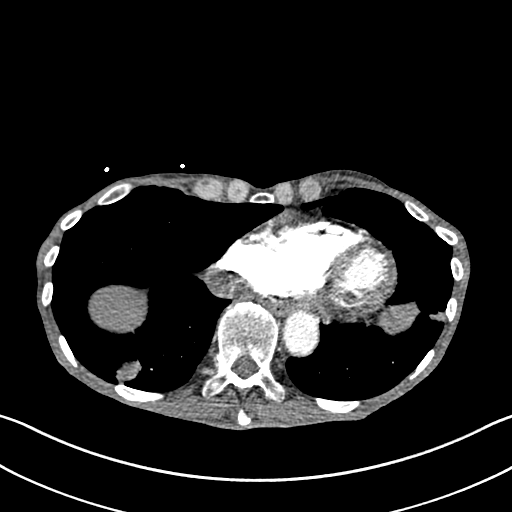
[im 89/342  lung]
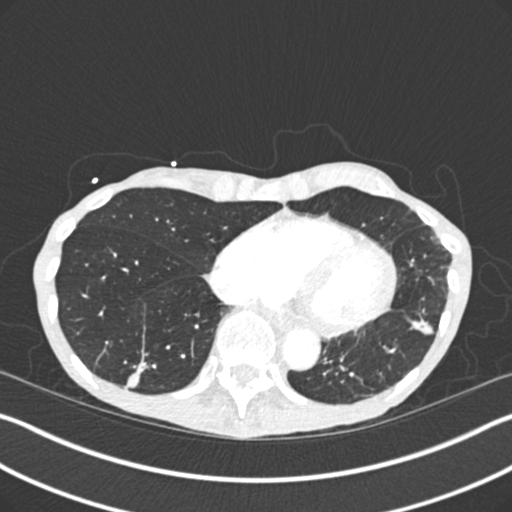
[im 119/342  soft-tissue]
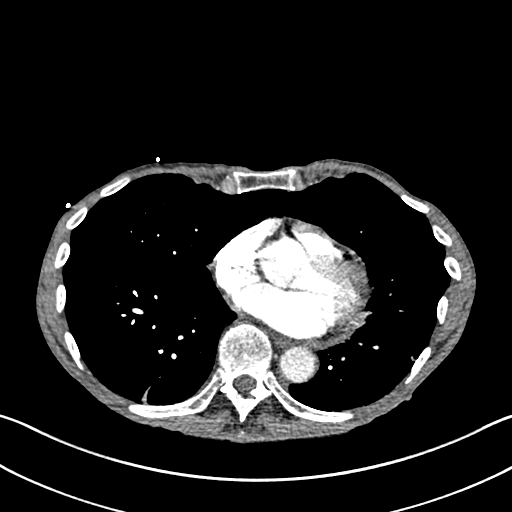
[im 134/342  lung]
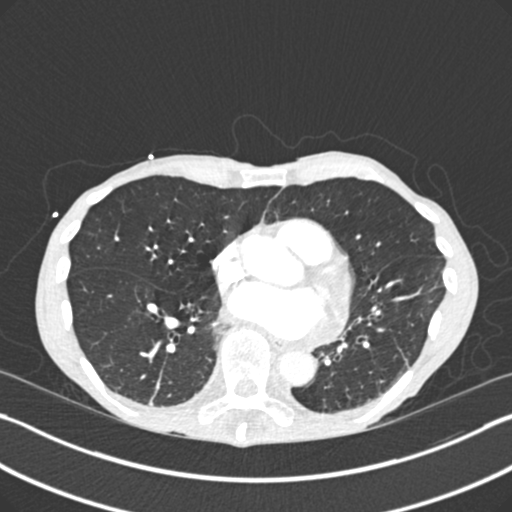
[im 149/342  soft-tissue]
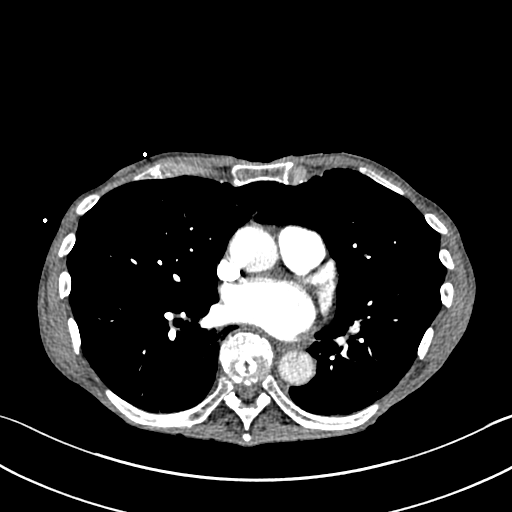
[im 178/342  lung]
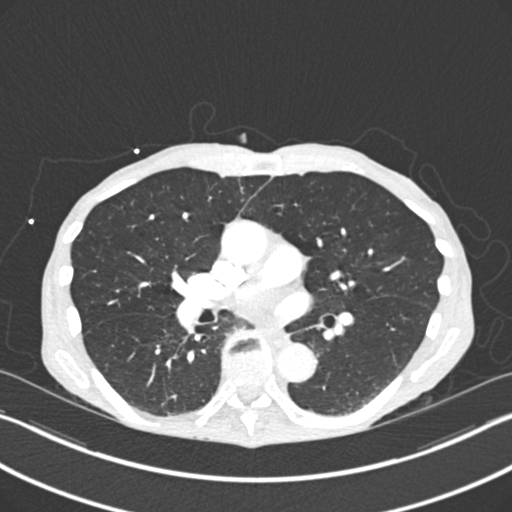
[im 193/342  soft-tissue]
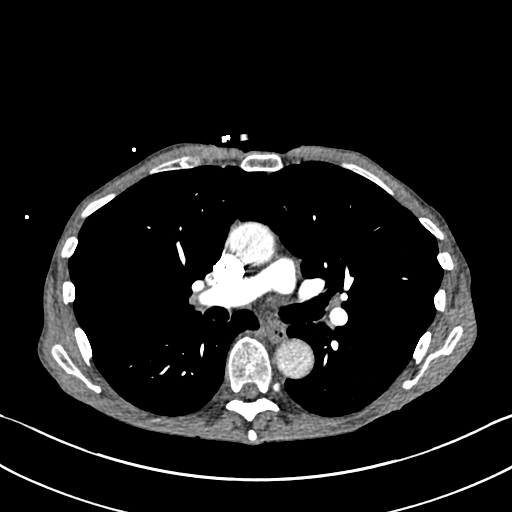
[im 208/342  lung]
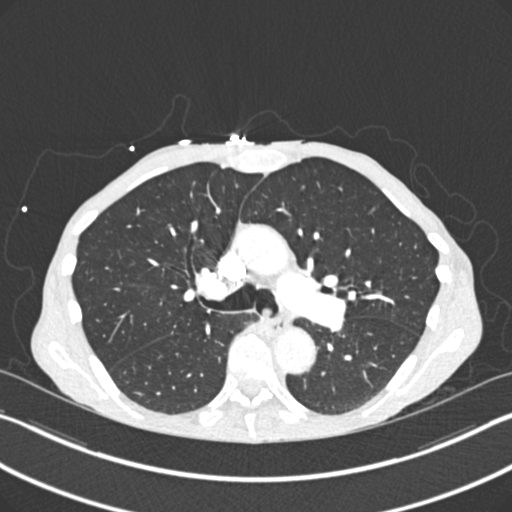
[im 223/342  soft-tissue]
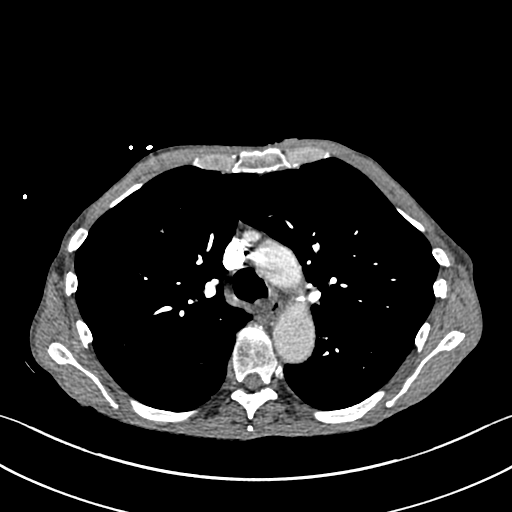
[im 253/342  lung]
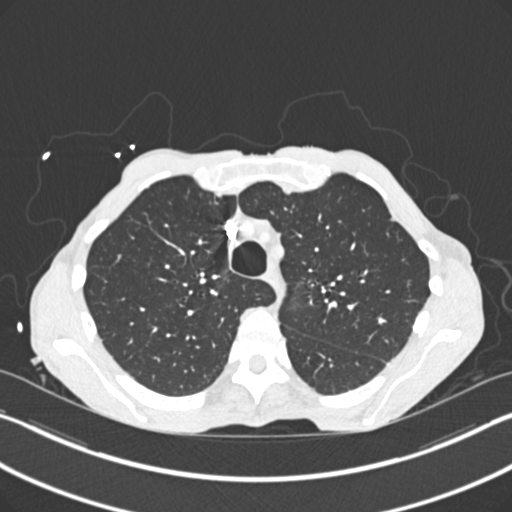
[im 267/342  soft-tissue]
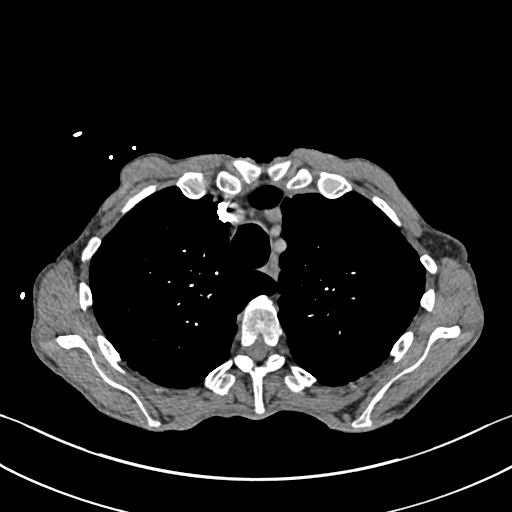
[im 282/342  lung]
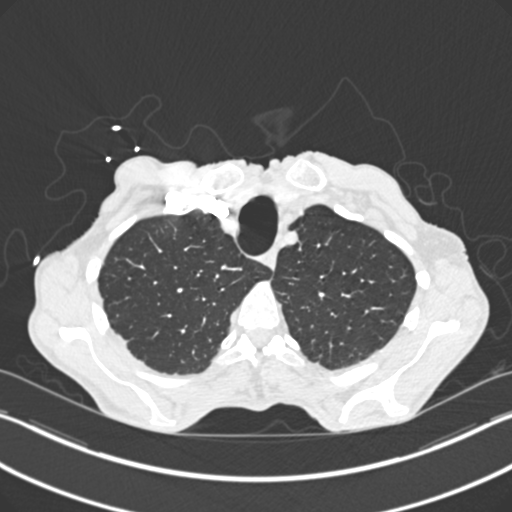
[im 312/342  soft-tissue]
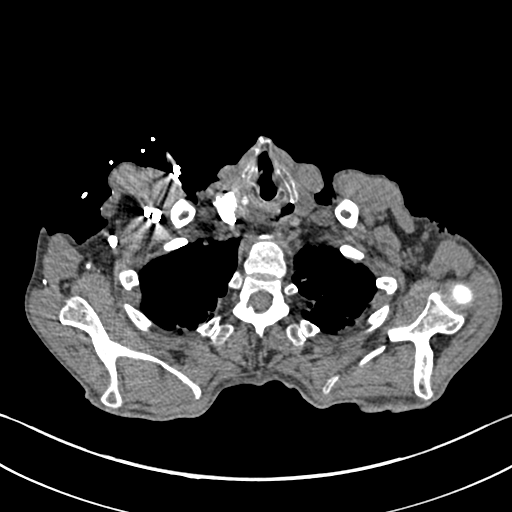
[im 327/342  lung]
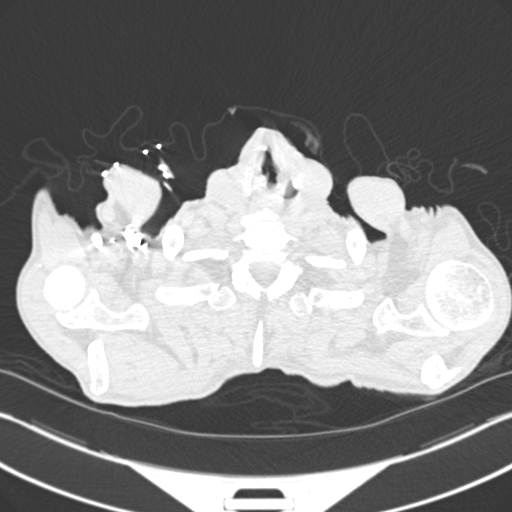

[Series 7: coronal mpr · coronal · 0.64mm/px · 2 of 72 slices shown]
[im 24/72  soft-tissue]
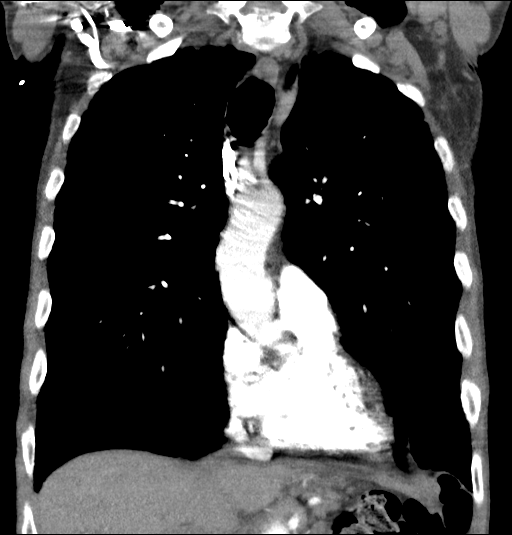
[im 48/72  soft-tissue]
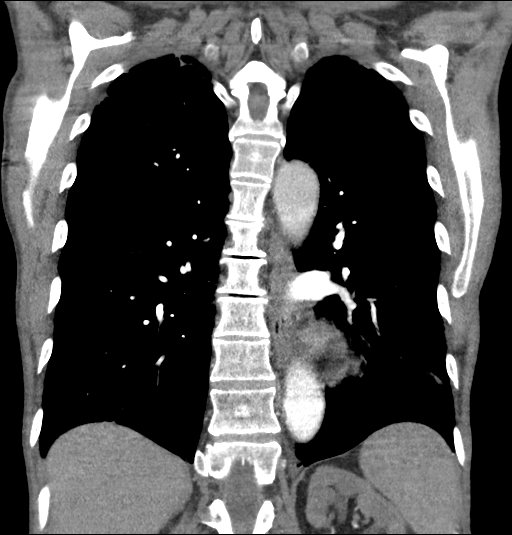

[19 of 46 positions shown; findings below may reference images not displayed]

FINDINGS: No filling defects in the pulmonary arterial tree to suggest acute
pulmonary thromboembolism.

No abnormal mediastinal adenopathy.

No pneumothorax.  No pleural effusion

Bibasilar indeterminate nodular parenchymal densities are associated
with subsegmental atelectasis. 1.8 x 1.2 cm density at the right
lung base on image 89. 1.4 x 0.9 cm left lower lobe nodular density
on image 88.

No acute bony deformity.

Calcified granulomata in the liver.

Review of the MIP images confirms the above findings.
IMPRESSION: No evidence of acute pulmonary thromboembolism

Bibasilar indeterminate nodular parenchymal densities are associated
with subsegmental atelectasis. Three-month follow-up is recommended
to ensure resolution. Initial follow-up by chest CT without contrast
is recommended in 3 months to confirm persistence. This
recommendation follows the consensus statement: Recommendations for
the Management of Subsolid Pulmonary Nodules Detected at CT: A
Statement from the [HOSPITAL] as published in Radiology

## 2016-09-07 IMAGING — CR DG CHEST 2V
1 series · 2 of 2 positions shown · non-contrast
Comparison: None.

CLINICAL DATA: Chest pain

EXAM:
CHEST  2 VIEW

[Series 1: w chest pa · 0.14mm/px · 2 of 2 slices shown]
[im 1/2]
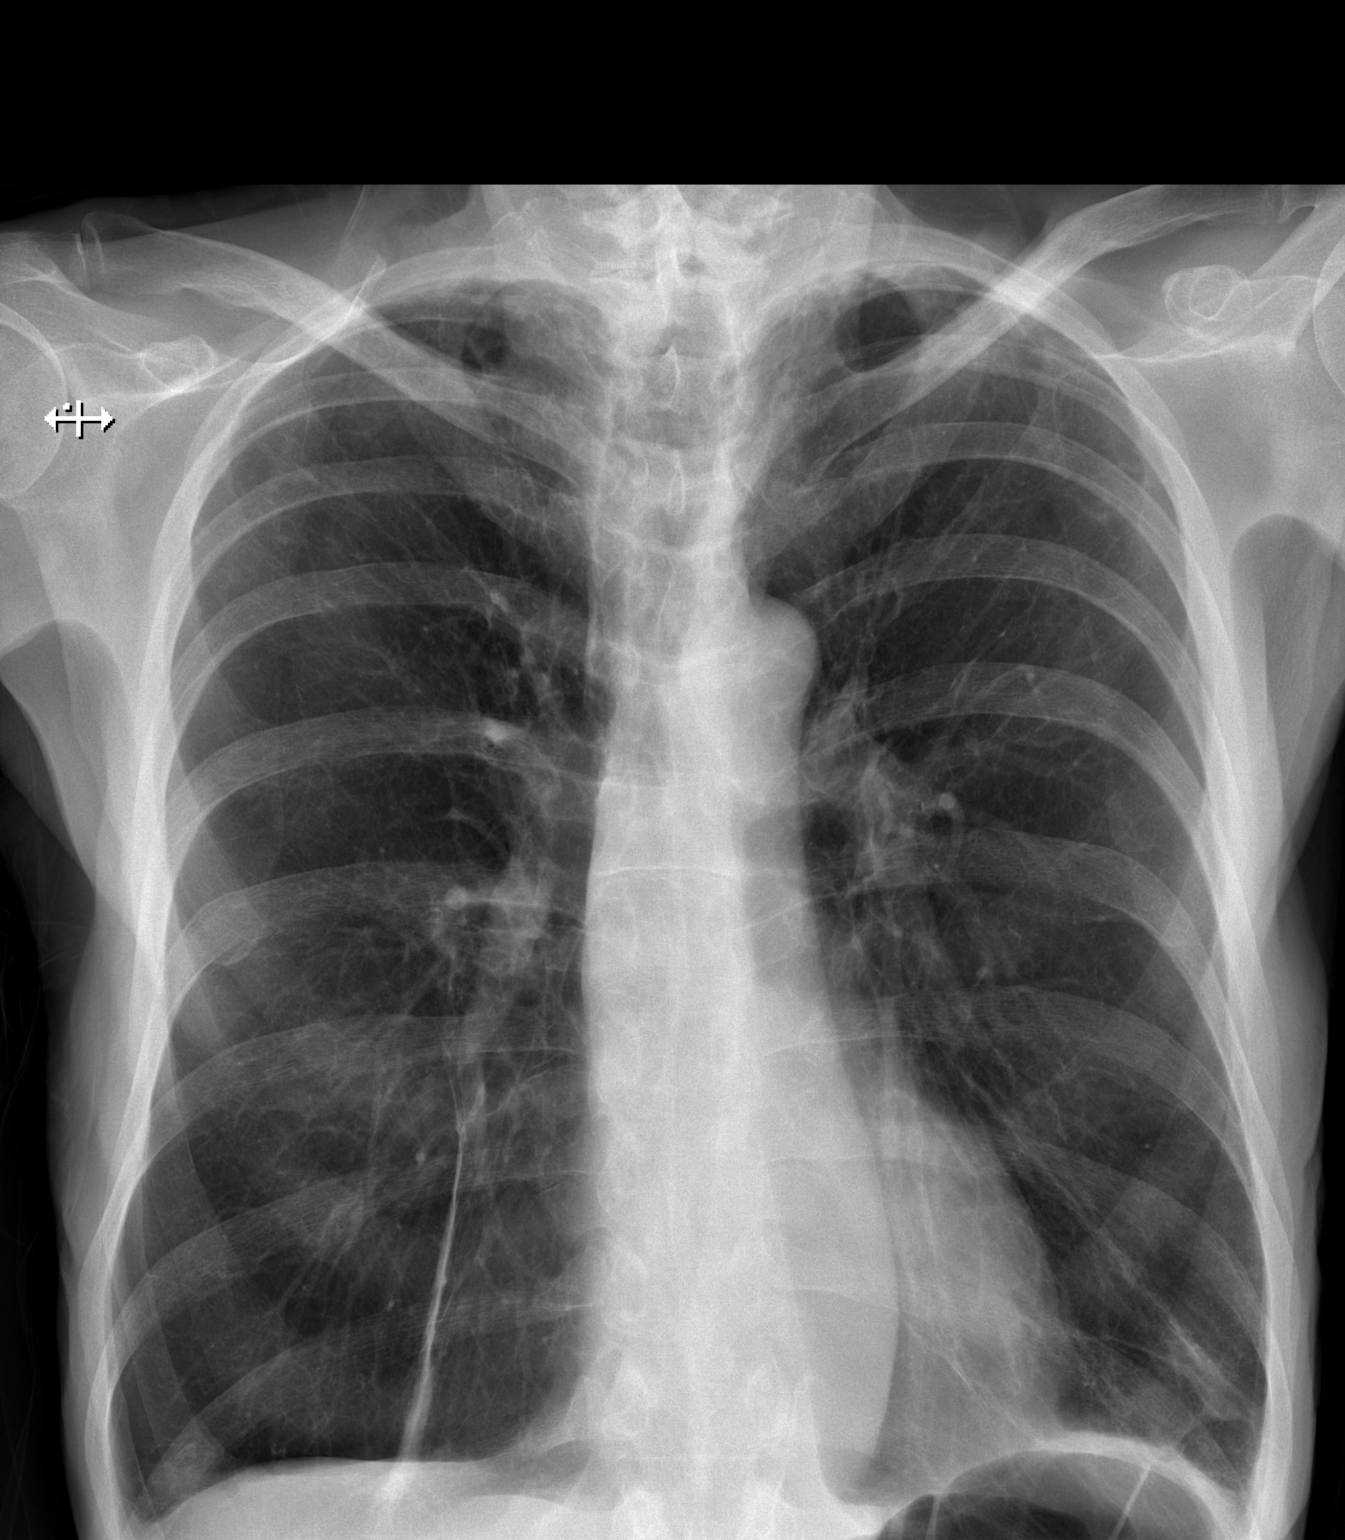
[im 2/2]
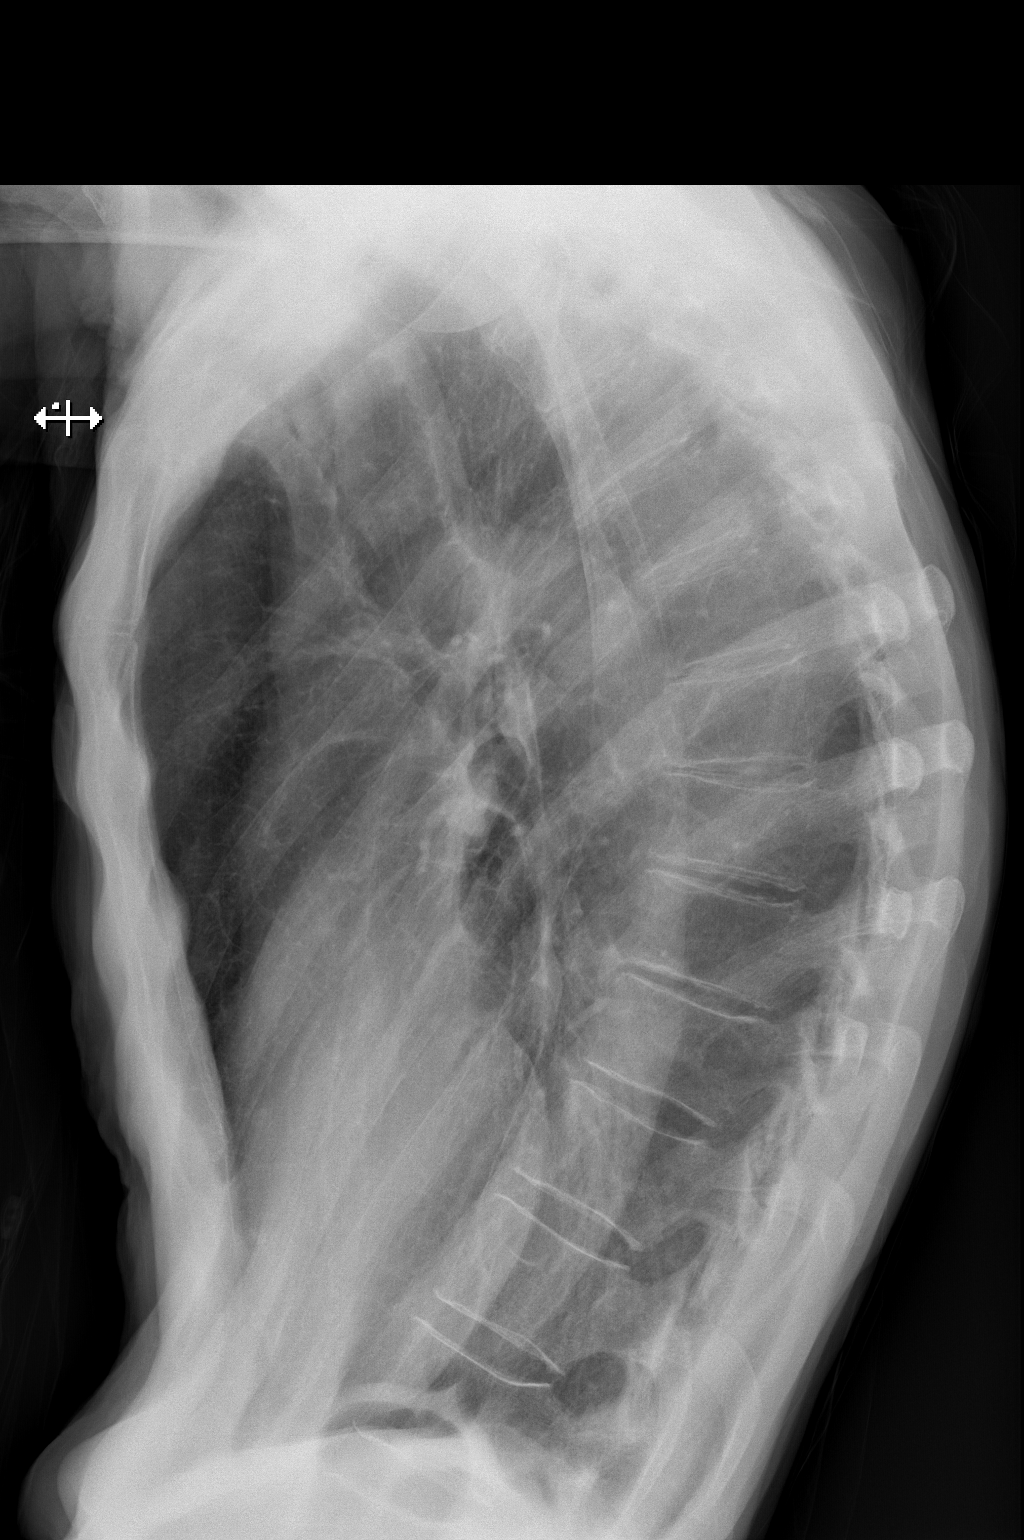

[2 of 2 positions shown; findings below may reference images not displayed]

FINDINGS: Normal heart size. Normal mediastinal contour. No pneumothorax. No
pleural effusion. Hyperinflated lungs. Emphysema. Mild scarring at
the lung apices and lung bases bilaterally. No pulmonary edema. No
acute consolidative airspace disease.
IMPRESSION: 1. No acute cardiopulmonary disease.  Mild scarring in the lungs.
2. Hyperinflated lungs and emphysema, suggesting COPD.

## 2017-03-29 ENCOUNTER — Telehealth: Payer: Self-pay | Admitting: Urology

## 2017-03-29 NOTE — Telephone Encounter (Signed)
Pt wants to know if medication may be suppressing his sex drive.  He is taking Dilantin, Cosegan and Haldol and vitamins.  Please call pt 2 or 3 times if you can't reach him.

## 2017-03-29 NOTE — Telephone Encounter (Signed)
Patient needs to schedule an apt with a provider to discuss

## 2017-08-24 ENCOUNTER — Emergency Department: Payer: Medicare Other

## 2017-08-24 ENCOUNTER — Emergency Department
Admission: EM | Admit: 2017-08-24 | Discharge: 2017-08-25 | Disposition: A | Discharge status: Home / Self Care | Payer: Medicare Other | Attending: Emergency Medicine | Admitting: Emergency Medicine

## 2017-08-24 ENCOUNTER — Other Ambulatory Visit: Payer: Self-pay

## 2017-08-24 DIAGNOSIS — R42 Dizziness and giddiness: Secondary | ICD-10-CM

## 2017-08-24 DIAGNOSIS — R531 Weakness: Secondary | ICD-10-CM | POA: Diagnosis present

## 2017-08-24 DIAGNOSIS — F0390 Unspecified dementia without behavioral disturbance: Secondary | ICD-10-CM | POA: Insufficient documentation

## 2017-08-24 LAB — COMPREHENSIVE METABOLIC PANEL
ALK PHOS: 149 U/L — AB (ref 38–126)
ALT: 20 U/L (ref 17–63)
AST: 30 U/L (ref 15–41)
Albumin: 3.7 g/dL (ref 3.5–5.0)
Anion gap: 7 (ref 5–15)
BUN: 28 mg/dL — ABNORMAL HIGH (ref 6–20)
CALCIUM: 8.9 mg/dL (ref 8.9–10.3)
CHLORIDE: 99 mmol/L — AB (ref 101–111)
CO2: 26 mmol/L (ref 22–32)
CREATININE: 1.32 mg/dL — AB (ref 0.61–1.24)
GFR, EST AFRICAN AMERICAN: 55 mL/min — AB (ref >60)
GFR, EST NON AFRICAN AMERICAN: 47 mL/min — AB (ref >60)
Glucose, Bld: 102 mg/dL — ABNORMAL HIGH (ref 65–99)
Potassium: 4.8 mmol/L (ref 3.5–5.1)
Sodium: 132 mmol/L — ABNORMAL LOW (ref 135–145)
Total Bilirubin: 0.6 mg/dL (ref 0.3–1.2)
Total Protein: 7.1 g/dL (ref 6.5–8.1)

## 2017-08-24 LAB — CBC
HCT: 38.4 % — ABNORMAL LOW (ref 40.0–52.0)
Hemoglobin: 12.8 g/dL — ABNORMAL LOW (ref 13.0–18.0)
MCH: 34.3 pg — AB (ref 26.0–34.0)
MCHC: 33.4 g/dL (ref 32.0–36.0)
MCV: 102.7 fL — AB (ref 80.0–100.0)
Platelets: 144 10*3/uL — ABNORMAL LOW (ref 150–440)
RBC: 3.74 MIL/uL — AB (ref 4.40–5.90)
RDW: 14 % (ref 11.5–14.5)
WBC: 6.3 10*3/uL (ref 3.8–10.6)

## 2017-08-24 LAB — URINALYSIS, COMPLETE (UACMP) WITH MICROSCOPIC
Bilirubin Urine: NEGATIVE
GLUCOSE, UA: NEGATIVE mg/dL
Hgb urine dipstick: NEGATIVE
Ketones, ur: NEGATIVE mg/dL
Leukocytes, UA: NEGATIVE
Nitrite: NEGATIVE
PH: 5 (ref 5.0–8.0)
Protein, ur: NEGATIVE mg/dL
Specific Gravity, Urine: 1.019 (ref 1.005–1.030)
Squamous Epithelial / LPF: NONE SEEN

## 2017-08-24 LAB — TROPONIN I

## 2017-08-24 MED ORDER — CEPHALEXIN 500 MG PO CAPS: 500.0000 mg | ORAL_CAPSULE | Freq: Four times a day (QID) | ORAL | 0 refills | Status: AC

## 2017-08-24 MED ORDER — SODIUM CHLORIDE 0.9 % IV BOLUS (SEPSIS)
1000.0000 mL | Freq: Once | INTRAVENOUS | Status: AC
  Administered 2017-08-24: 1000 mL via INTRAVENOUS

## 2017-08-24 MED ORDER — CEPHALEXIN 500 MG PO CAPS
500.0000 mg | ORAL_CAPSULE | Freq: Once | ORAL | Status: AC
  Administered 2017-08-24: 500 mg via ORAL
  Filled 2017-08-24: qty 1

## 2017-08-24 NOTE — ED Notes (Signed)
Pt taken to CT via stretcher.

## 2017-08-24 NOTE — ED Triage Notes (Signed)
Pt in with co bilat leg weakness since today denies any recent illness. States no fever, no pain at this time. Pt denies any falls in the last week.

## 2017-08-24 NOTE — Discharge Instructions (Addendum)
Please drink plenty of fluids stay well-hydrated.  Eat small regular meals throughout the day and get plenty of rest.  Please follow-up with your primary care physician and have them follow up the results of your urine culture.  Return to the emergency department if you develop severe pain, fever, lightheadedness or fainting, numbness tingling or weakness, or any other symptoms concerning to you.

## 2017-08-24 NOTE — ED Provider Notes (Signed)
[facility] Emergency Department Provider Note  ____________________________________________  Time seen: Approximately 10:00 PM  I have reviewed the triage vital signs and the nursing notes.   HISTORY  Chief Complaint Weakness    HPI Ryan Dalton is a 83 y.o. male who lives at home brought by his caregiver for generalized weakness.  The patient reports that he generally walks without a walker, but today he felt "wobbly" on both legs.  He also reports some mild lightheadedness.  He used a walker for the first time for ambulation.  He had a fall approximately 1 month ago resulting in carpet burn, but denies any falls today.  He denies any recent changes in his medications, chest pain, shortness of breath, palpitations, numbness or tingling, speech or vision changes, dark or bloody stools.  He has not had any recent illness including cough or cold symptoms, sore throat, ear pain, nausea vomiting or diarrhea.  No headache.  The patient reports he has been eating and drinking normally.   Past Medical History:  Diagnosis Date  . Dizziness   . ED (erectile dysfunction)   . Urinary frequency     Patient Active Problem List   Diagnosis Date Noted  . Erectile dysfunction due to arterial insufficiency 10/17/2015  . Other ejaculatory dysfunction 10/17/2015  . Absolute anemia 09/04/2014  . 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 Seizure (HCC) 02/26/2014  . Change in blood platelet count 02/26/2014  . Schizophrenia (HCC) 02/26/2014  . Thrombocytopenia (HCC) 02/26/2014  . Lumbar and sacral arthritis 01/10/2014  . Lumbar radiculopathy 11/17/2013    Past Surgical History:  Procedure Laterality Date  . cataract surgery    . TONSILLECTOMY      Current Outpatient Rx  . Order #: [id number] Class: Historical Med  . Order #: [id number] Class: Historical Med  . Order #: [id number] Class: Historical Med  . Order #: [id number] Class: Historical Med  . Order #:  [id number] Class: Print    Allergies Patient has no known allergies.  Family History  Problem Relation Age of Onset  . Prostate cancer Neg Hx   . Kidney cancer Neg Hx   . Bladder Cancer Neg Hx     Social History Social History   Tobacco Use  . Smoking status: Never Smoker  . Smokeless tobacco: Never Used  Substance Use Topics  . Alcohol use: No    Alcohol/week: 0.0 oz  . Drug use: No    Review of Systems Constitutional: No fever/chills.  Mild lightheadedness without syncope. Eyes: No visual changes.  No blurred or double vision. ENT: No sore throat. No congestion or rhinorrhea.  No ear pain. Cardiovascular: Denies chest pain. Denies palpitations. Respiratory: Denies shortness of breath.  No cough. Gastrointestinal: No abdominal pain.  No nausea, no vomiting.  No diarrhea.  No constipation. Genitourinary: Negative for dysuria. Musculoskeletal: Negative for back pain. Skin: Negative for rash. Neurological: Negative for headaches. No focal numbness, tingling.  Positive bilateral lower extremity weakness.  No altered mental status.  No changes in vision or speech.    ____________________________________________   PHYSICAL EXAM:  VITAL SIGNS: ED Triage Vitals  Enc Vitals Group     BP 08/24/17 1946 128/69     Pulse Rate 08/24/17 1946 (!) 104     Resp 08/24/17 1946 16     Temp 08/24/17 1946 98 F (36.7 C)     Temp Source 08/24/17 1946 Oral     SpO2 08/24/17 1946 100 %     Weight 08/24/17 1949 135 lb (  61.2 kg)     Height 08/24/17 1949 6\' 2"  (1.88 m)     Head Circumference --      Peak Flow --      Pain Score --      Pain Loc --      Pain Edu? --      Excl. in GC? --     Constitutional: Alert and oriented. Well appearing for his age and in no acute distress. Answers questions appropriately. Eyes: Conjunctivae are normal.  EOMI. No scleral icterus. Head: Atraumatic. Nose: No congestion/rhinnorhea. Mouth/Throat: Mucous membranes are mildly dry.  Neck: No  stridor.  Supple.  No JVD.  No meningismus. Cardiovascular: Normal rate, regular rhythm. No murmurs, rubs or gallops.  Respiratory: Normal respiratory effort.  No accessory muscle use or retractions. Lungs CTAB.  No wheezes, rales or ronchi. Gastrointestinal: Soft, nontender and nondistended.  No guarding or rebound.  No peritoneal signs. Musculoskeletal: No LE edema. No ttp in the calves or palpable cords.  Negative Homan's sign. Neurologic: Alert and oriented 3. Speech is clear. Face and smile symmetric. Tongue is midline.  EOMI.  PERRLA.  No vertical or horizontal nystagmus.  No pronator drift. 5 out of 5 grip, biceps, triceps, hip flexors, plantar flexion and dorsiflexion. Normal sensation to light touch in the bilateral upper and lower extremities, and face.  I ambulated the patient who was steady, with small steps but no obvious ataxia or other gait abnormality Skin:  Skin is warm, dry and intact. No rash noted. Psychiatric: Mood and affect are normal. Speech and behavior are normal.  Normal judgement.  ____________________________________________   LABS (all labs ordered are listed, but only abnormal results are displayed)  Labs Reviewed  CBC - Abnormal; Notable for the following components:      Result Value   RBC 3.74 (*)    Hemoglobin 12.8 (*)    HCT 38.4 (*)    MCV 102.7 (*)    MCH 34.3 (*)    Platelets 144 (*)    All other components within normal limits  COMPREHENSIVE METABOLIC PANEL - Abnormal; Notable for the following components:   Sodium 132 (*)    Chloride 99 (*)    Glucose, Bld 102 (*)    BUN 28 (*)    Creatinine, Ser 1.32 (*)    Alkaline Phosphatase 149 (*)    GFR calc non Af Amer 47 (*)    GFR calc Af Amer 55 (*)    All other components within normal limits  URINALYSIS, COMPLETE (UACMP) WITH MICROSCOPIC - Abnormal; Notable for the following components:   Color, Urine YELLOW (*)    APPearance HAZY (*)    Bacteria, UA RARE (*)    All other components within  normal limits  URINE CULTURE  TROPONIN I   ____________________________________________  EKG  ED ECG REPORT I, Rockne MenghiniNorman, Anne-Caroline, the attending physician, personally viewed and interpreted this ECG.   Date: 08/24/2017  EKG Time: 1954  Rate: 77  Rhythm: normal sinus rhythm  Axis: leftward  Intervals:none  ST&T Change: No STEMI  ____________________________________________  RADIOLOGY  Dg Chest 2 View  Result Date: 08/24/2017 CLINICAL DATA:  Weakness EXAM: CHEST  2 VIEW COMPARISON:  06/17/2016 FINDINGS: Hyperinflation with emphysematous disease. Linear scarring at the right base. Scarring at the left base. No consolidation or effusion. Stable heart size. No pneumothorax. IMPRESSION: Hyperinflation with emphysematous disease and stable scarring in the bases. No acute pulmonary infiltrate. Electronically Signed   By: Adrian ProwsKim  Fujinaga M.D.  On: 08/24/2017 20:27   Ct Head Wo Contrast  Result Date: 08/24/2017 CLINICAL DATA:  Bilateral leg weakness beginning today. EXAM: CT HEAD WITHOUT CONTRAST TECHNIQUE: Contiguous axial images were obtained from the base of the skull through the vertex without intravenous contrast. COMPARISON:  07/28/2007 FINDINGS: Brain: Diffuse cerebral atrophy. Ventricular dilatation consistent with central atrophy. Low-attenuation changes in the deep white matter consistent small vessel ischemia. Old lacunar infarcts in the deep white matter on the left. No mass-effect or midline shift. No abnormal extra-axial fluid collections. Gray-white matter junctions are distinct. Basal cisterns are not effaced. No acute intracranial hemorrhage. Vascular: No hyperdense vessel or unexpected calcification. Skull: Calvarium appears intact. Sinuses/Orbits: Paranasal sinuses and mastoid air cells are clear. Other: None. IMPRESSION: No acute intracranial abnormalities. Chronic atrophy and small vessel ischemic changes. Electronically Signed   By: Burman NievesWilliam  Stevens M.D.   On: 08/24/2017  22:38    ____________________________________________   PROCEDURES  Procedure(s) performed: None  Procedures  Critical Care performed: No ____________________________________________   INITIAL IMPRESSION / ASSESSMENT AND PLAN / ED COURSE  Pertinent labs & imaging results that were available during my care of the patient were reviewed by me and considered in my medical decision making (see chart for details).  83 y.o. male with generalized lower extremity weakness, difficulty walking today.  The patient is hemodynamically stable and does not give any signs or symptoms that would be consistent with infection.  His neurologic exam is very reassuring, and upon ambulation, the patient was strong with a normal gait and states that it felt much better than earlier today.  There are multiple possible etiologies for the patient's weakness earlier today.  At this time, there is no evidence of any acute CVA but will get a CT scan for complete evaluation.  I do not see any evidence of arrhythmia although a transient arrhythmia is possible.  The patient has rare bacteria without any other signs of infection, but I will treat him with Keflex for UTI in case this may be causing his weakness.  The patient blood counts which are unchanged.  He does have some mild hyponatremia which appears to be stable for him.  His renal insufficiency is also chronic.  He has an EKG without ischemic changes and a negative troponin.  If the patient's head CT is reassuring, we will plan to discharge him home with close PMD follow-up and falls precautions.  Return precautions were discussed.  CT of the head shows no acute intracranial process.  The patient is not orthostatic.  The patient is discharged in stable condition.  I discussed the results with the patient and his caregiver.  I reviewed the patient's medical chart.  ____________________________________________  FINAL CLINICAL IMPRESSION(S) / ED DIAGNOSES  Final  diagnoses:  Generalized weakness  Lightheadedness         NEW MEDICATIONS STARTED DURING THIS VISIT:  This SmartLink is deprecated. Use AVSMEDLIST instead to display the medication list for a patient.    Rockne MenghiniNorman, Anne-Caroline, MD 08/24/17 [phone]

## 2017-08-24 NOTE — ED Notes (Signed)
Pt is here with caregiver. Pt lives home alone. States today and yesterday felt like bilat legs feel weak like "they're going to give out on me." pt also states neck weakness. Caregiver thinks pt is having difficulty breathing because he is mouth breathing. Pt is alert and oriented. States no pain. States used a Press photographerwalker tonight but doesn't normally. States fell about a month ago and skinned up elbows. States he has been eating and drinking well at home.

## 2017-08-26 LAB — URINE CULTURE: Culture: NO GROWTH

## 2017-12-21 ENCOUNTER — Other Ambulatory Visit
Admission: RE | Admit: 2017-12-21 | Discharge: 2017-12-21 | Disposition: A | Discharge status: Home / Self Care | Payer: Medicare Other | Source: Ambulatory Visit | Attending: Internal Medicine | Admitting: Internal Medicine

## 2017-12-21 DIAGNOSIS — Z5181 Encounter for therapeutic drug level monitoring: Secondary | ICD-10-CM | POA: Diagnosis present

## 2017-12-21 LAB — PHENYTOIN LEVEL, TOTAL: Phenytoin Lvl: 14.5 ug/mL (ref 10.0–20.0)

## 2017-12-27 ENCOUNTER — Other Ambulatory Visit
Admission: RE | Admit: 2017-12-27 | Discharge: 2017-12-27 | Disposition: A | Discharge status: Home / Self Care | Payer: Medicare Other | Source: Ambulatory Visit | Attending: Internal Medicine | Admitting: Internal Medicine

## 2017-12-27 DIAGNOSIS — R569 Unspecified convulsions: Secondary | ICD-10-CM | POA: Diagnosis present

## 2017-12-27 LAB — PHENYTOIN LEVEL, TOTAL: PHENYTOIN LVL: 11.8 ug/mL (ref 10.0–20.0)

## 2018-04-20 ENCOUNTER — Other Ambulatory Visit
Admission: RE | Admit: 2018-04-20 | Discharge: 2018-04-20 | Disposition: A | Discharge status: Home / Self Care | Payer: Medicare Other | Source: Other Acute Inpatient Hospital | Attending: Pediatrics | Admitting: Pediatrics

## 2018-04-20 DIAGNOSIS — R569 Unspecified convulsions: Secondary | ICD-10-CM | POA: Insufficient documentation

## 2018-04-20 LAB — PHENYTOIN LEVEL, TOTAL: PHENYTOIN LVL: 11.4 ug/mL (ref 10.0–20.0)

## 2018-08-19 DEATH — deceased
